# Patient Record
Sex: Female | Born: 1970
Health system: Southern US, Community
[De-identification: ages and names within clinical notes are randomized; demographics above are authoritative.]

## PROBLEM LIST (undated history)

## (undated) DIAGNOSIS — E7800 Pure hypercholesterolemia, unspecified: Secondary | ICD-10-CM

## (undated) DIAGNOSIS — J45909 Unspecified asthma, uncomplicated: Secondary | ICD-10-CM

## (undated) DIAGNOSIS — I10 Essential (primary) hypertension: Secondary | ICD-10-CM

## (undated) DIAGNOSIS — M1990 Unspecified osteoarthritis, unspecified site: Secondary | ICD-10-CM

## (undated) DIAGNOSIS — J3501 Chronic tonsillitis: Secondary | ICD-10-CM

## (undated) DIAGNOSIS — O24419 Gestational diabetes mellitus in pregnancy, unspecified control: Secondary | ICD-10-CM

## (undated) DIAGNOSIS — D649 Anemia, unspecified: Secondary | ICD-10-CM

## (undated) DIAGNOSIS — B1910 Unspecified viral hepatitis B without hepatic coma: Secondary | ICD-10-CM

## (undated) DIAGNOSIS — G43909 Migraine, unspecified, not intractable, without status migrainosus: Secondary | ICD-10-CM

## (undated) DIAGNOSIS — R51 Headache: Principal | ICD-10-CM

## (undated) HISTORY — PX: NO PAST SURGERIES: SHX2092

## (undated) HISTORY — PX: TONSILLECTOMY: SUR1361

## (undated) HISTORY — DX: Gestational diabetes mellitus in pregnancy, unspecified control: O24.419

---

## 2006-01-13 ENCOUNTER — Emergency Department (HOSPITAL_COMMUNITY): Admission: EM | Admit: 2006-01-13 | Discharge: 2006-01-13 | Payer: Self-pay | Admitting: Emergency Medicine

## 2008-06-22 ENCOUNTER — Inpatient Hospital Stay (HOSPITAL_COMMUNITY): Admission: AD | Admit: 2008-06-22 | Discharge: 2008-06-28 | Payer: Self-pay | Admitting: Obstetrics & Gynecology

## 2008-06-23 ENCOUNTER — Encounter: Payer: Self-pay | Admitting: Obstetrics

## 2009-01-19 ENCOUNTER — Emergency Department (HOSPITAL_COMMUNITY): Admission: EM | Admit: 2009-01-19 | Discharge: 2009-01-19 | Payer: Self-pay | Admitting: Emergency Medicine

## 2010-04-22 LAB — COMPREHENSIVE METABOLIC PANEL
ALT: 43 U/L — ABNORMAL HIGH (ref 0–35)
ALT: 51 U/L — ABNORMAL HIGH (ref 0–35)
AST: 32 U/L (ref 0–37)
Albumin: 2.1 g/dL — ABNORMAL LOW (ref 3.5–5.2)
Albumin: 2.4 g/dL — ABNORMAL LOW (ref 3.5–5.2)
Alkaline Phosphatase: 141 U/L — ABNORMAL HIGH (ref 39–117)
BUN: 3 mg/dL — ABNORMAL LOW (ref 6–23)
CO2: 23 mEq/L (ref 19–32)
CO2: 25 mEq/L (ref 19–32)
Calcium: 7.5 mg/dL — ABNORMAL LOW (ref 8.4–10.5)
Calcium: 8.7 mg/dL (ref 8.4–10.5)
Chloride: 109 mEq/L (ref 96–112)
Creatinine, Ser: 0.68 mg/dL (ref 0.4–1.2)
GFR calc non Af Amer: >60 mL/min (ref >60)
Glucose, Bld: 94 mg/dL (ref 70–99)
Sodium: 132 mEq/L — ABNORMAL LOW (ref 135–145)
Total Bilirubin: 0.7 mg/dL (ref 0.3–1.2)
Total Protein: 5.5 g/dL — ABNORMAL LOW (ref 6.0–8.3)

## 2010-04-22 LAB — CBC
HCT: 27.9 % — ABNORMAL LOW (ref 36.0–46.0)
HCT: 30.4 % — ABNORMAL LOW (ref 36.0–46.0)
Hemoglobin: 10.6 g/dL — ABNORMAL LOW (ref 12.0–15.0)
MCHC: 34.7 g/dL (ref 30.0–36.0)
MCHC: 34.9 g/dL (ref 30.0–36.0)
MCHC: 35 g/dL (ref 30.0–36.0)
MCV: 93.7 fL (ref 78.0–100.0)
MCV: 94.6 fL (ref 78.0–100.0)
Platelets: 130 10*3/uL — ABNORMAL LOW (ref 150–400)
RBC: 2.95 MIL/uL — ABNORMAL LOW (ref 3.87–5.11)
RBC: 3.24 MIL/uL — ABNORMAL LOW (ref 3.87–5.11)
RBC: 3.63 MIL/uL — ABNORMAL LOW (ref 3.87–5.11)
RDW: 13.9 % (ref 11.5–15.5)
RDW: 14.1 % (ref 11.5–15.5)

## 2010-04-22 LAB — URINALYSIS, ROUTINE W REFLEX MICROSCOPIC
Bilirubin Urine: NEGATIVE
Leukocytes, UA: NEGATIVE
Nitrite: NEGATIVE
Protein, ur: 100 mg/dL — AB
Specific Gravity, Urine: 1.02 (ref 1.005–1.030)
Urobilinogen, UA: 0.2 mg/dL (ref 0.0–1.0)

## 2010-04-22 LAB — URINE MICROSCOPIC-ADD ON

## 2010-04-22 LAB — BASIC METABOLIC PANEL
BUN: 5 mg/dL — ABNORMAL LOW (ref 6–23)
Calcium: 8.3 mg/dL — ABNORMAL LOW (ref 8.4–10.5)
Creatinine, Ser: 0.64 mg/dL (ref 0.4–1.2)
GFR calc Af Amer: >60 mL/min (ref >60)
GFR calc non Af Amer: >60 mL/min (ref >60)
Glucose, Bld: 136 mg/dL — ABNORMAL HIGH (ref 70–99)

## 2010-04-22 LAB — GLUCOSE, CAPILLARY: Glucose-Capillary: 72 mg/dL (ref 70–99)

## 2010-04-22 LAB — URIC ACID: Uric Acid, Serum: 6.6 mg/dL (ref 2.4–7.0)

## 2010-04-22 LAB — MAGNESIUM: Magnesium: 5.8 mg/dL — ABNORMAL HIGH (ref 1.5–2.5)

## 2010-05-28 NOTE — Discharge Summary (Signed)
Mary Richardson, Mary Richardson                  ACCOUNT NO.:  [id number]   MEDICAL RECORD NO.:  [id number]          PATIENT TYPE:  INP   LOCATION:  9141                          FACILITY:  [facility]   PHYSICIAN:  Charles A. Clearance Coots, M.D.DATE OF BIRTH:  [birth year]   DATE OF ADMISSION:  06/22/2008  DATE OF DISCHARGE:  06/28/2008                               DISCHARGE SUMMARY   ADMITTING DIAGNOSIS:  A 37 weeks' gestation, preeclampsia.   DISCHARGE DIAGNOSES:  A 37 weeks' gestation, preeclampsia, status post  normal spontaneous delivery, viable female infant on June 23, 2008 at  1813, Apgars of 8 at 1 minute and 4 at 5 minutes and 9 at 10 minutes.  Postpartum, preeclampsia resolving.  The patient and infant discharged  home in good condition.   REASON FOR ADMISSION:  A 40 year old G1, estimated date of confinement  July 14, 2008, presented to the office with complaint of tiredness and  swelling of her feet.  She denied headache or visual changes.  The  pregnancy had been complicated by gestational diabetes and this was well  controlled with diet.  Blood pressures had been increasing during the  latter week of prenatal care.   PAST MEDICAL HISTORY:   SURGERY:  None.   ILLNESSES:  Migraines.   MEDICATIONS:  Prenatal vitamins.   ALLERGIES:  No known drug allergies.   FAMILY HISTORY:  Remarkable for diabetes and hypertension.   SOCIAL HISTORY:  Negative for tobacco, alcohol, or recreational drug  use.   REVIEW OF SYSTEMS:  Per history of present illness, remarkable for  lethargy and swelling of feet in lower extremity.   PHYSICAL EXAMINATION:  GENERAL:  Well-nourished, well-developed female  in no acute distress.  VITAL SIGNS:  Temperature 98.8, pulse 72, respiratory rate 18, blood  pressure 160/98, and repeat blood pressure was 150/90.  LUNGS:  Clear to  auscultation bilaterally.  HEART:  Regular rate and rhythm.  HEENT:  Normal.  ABDOMEN:  Gravid, nontender.  Cervix 1 cm dilated, 50% effaced,  and  vertex at a -3 station.   The patient was sent for ultrasound and ultrasound measurements were  consistent with 34 weeks' gestation and amniotic fluid index was within  normal limits.  Placenta was posterior with no placenta previa and the  presentation was cephalic.  The findings were discussed with Maternal  Fetal Medicine here at [facility] along with her blood and urine.   LABORATORY WORK:  Remarkable for hemoglobin of 10, hematocrit 30, white  blood cell count 6000, platelets of 146,000, sodium 138, potassium 3.3.  AST was 32 and ALT was 43.  LDH was 141 and uric acid 5.8.  Urinalysis  revealed protein of 100 mg/dL.  After discussion of findings with  Maternal Fetal Medicine, the recommendation was to admit the patient and  induce labor for preeclampsia.   HOSPITAL COURSE:  The patient was admitted and two-stage induction of  labor was started.  The patient progressed quite well on Pitocin and had  an normal spontaneous vaginal delivery on June 23, 2008, without  complications.  Postpartum course was complicated  by increased blood  pressures.  The patient was placed on magnesium sulfate during labor and  continued on magnesium sulfate postpartum along with IV and p.o.  labetalol as needed.  Blood pressures slowly decreased and urine output  slowly increased until her urine output was brisk by postpartum day #3  and the magnesium sulfate was discontinued.  She continued to improve  following hospital stay and was discharged home on postpartum day #6  with reassuring blood pressures and brisk urine output.   DISCHARGE LABORATORY:  Hemoglobin 9.7, hematocrit 27, white blood cell  count 10,900, and platelets 130,000.  AST was 41 and ALT was 51.  Uric  acid was 6.6 and LDH was 239.   DISCHARGE DISPOSITION:  Medications, Coreg and Norvasc was prescribed  for blood pressure management.  Percocet and ibuprofen was prescribed  for pain.  Blood pressures will be followed at  home by the home nurse  that will check the patient's blood pressure.  She is to follow up in  the office in 2 weeks.      Charles A. Clearance Coots, M.D.  Electronically Signed     CAH/MEDQ  D:  06/28/2008  T:  06/28/2008  Job:  [id number]

## 2010-11-04 ENCOUNTER — Emergency Department (HOSPITAL_BASED_OUTPATIENT_CLINIC_OR_DEPARTMENT_OTHER)
Admission: EM | Admit: 2010-11-04 | Discharge: 2010-11-04 | Disposition: A | Discharge status: Home / Self Care | Payer: Self-pay | Attending: Emergency Medicine | Admitting: Emergency Medicine

## 2010-11-04 ENCOUNTER — Encounter: Payer: Self-pay | Admitting: Student

## 2010-11-04 DIAGNOSIS — N750 Cyst of Bartholin's gland: Secondary | ICD-10-CM | POA: Insufficient documentation

## 2010-11-04 LAB — URINALYSIS, ROUTINE W REFLEX MICROSCOPIC
Leukocytes, UA: NEGATIVE
Nitrite: NEGATIVE
Urobilinogen, UA: 0.2 mg/dL (ref 0.0–1.0)
pH: 5 (ref 5.0–8.0)

## 2010-11-04 LAB — PREGNANCY, URINE: Preg Test, Ur: NEGATIVE

## 2010-11-04 MED ORDER — LIDOCAINE HCL 2 % IJ SOLN
INTRAMUSCULAR | Status: AC
  Administered 2010-11-04: 11:00:00
  Filled 2010-11-04: qty 1

## 2010-11-04 MED ORDER — SULFAMETHOXAZOLE-TRIMETHOPRIM 800-160 MG PO TABS: 1.0000 | ORAL_TABLET | Freq: Two times a day (BID) | ORAL | Status: AC

## 2010-11-04 MED ORDER — OXYCODONE-ACETAMINOPHEN 5-325 MG PO TABS: 2.0000 | ORAL_TABLET | ORAL | Status: AC | PRN

## 2010-11-04 NOTE — ED Notes (Signed)
Pt reports abscess/boil to outer right labia x 3 days

## 2010-11-04 NOTE — ED Provider Notes (Signed)
History     CSN: [id number] Arrival date & time: 11/04/2010 10:26 AM   First MD Initiated Contact with Patient 11/04/10 1044      Chief Complaint  Patient presents with  . Abscess    (Consider location/radiation/quality/duration/timing/severity/associated sxs/prior treatment) HPI Comments: Abscess to right labia.  No alleviating factors.  Worse with touching, palpation.  Patient is a 40 y.o. female presenting with abscess. The history is provided by the patient.  Abscess  This is a new problem. The current episode started less than one week ago. The onset was gradual. The problem occurs continuously. The problem has been gradually worsening. Affected Location: right labia. The problem is moderate. The abscess is characterized by swelling.    History reviewed. No pertinent past medical history.  History reviewed. No pertinent past surgical history.  History reviewed. No pertinent family history.  History  Substance Use Topics  . Smoking status: Never Smoker   . Smokeless tobacco: Not on file  . Alcohol Use: No    OB History    Grav Para Term Preterm Abortions TAB SAB Ect Mult Living                  Review of Systems  Constitutional: Negative.  Negative for chills and fatigue.  Gastrointestinal: Negative.   Genitourinary: Positive for vaginal pain.  Skin:       Abscess as above.    Allergies  Review of patient's allergies indicates no known allergies.  Home Medications  No current outpatient prescriptions on file.  BP 127/90  Pulse 79  Temp(Src) 98.6 F (37 C) (Oral)  Resp 20  Wt 213 lb (96.616 kg)  SpO2 100%  LMP 10/23/2010  Physical Exam  Constitutional: She is oriented to person, place, and time. She appears well-developed and well-nourished. No distress.  HENT:  Head: Normocephalic and atraumatic.  Neck: Normal range of motion. Neck supple.  Genitourinary:       There is moderate-sized abscess located on the right labia.  It is exquisitely  tender to the touch.  Musculoskeletal: Normal range of motion.  Neurological: She is alert and oriented to person, place, and time.  Skin: Skin is warm and dry. She is not diaphoretic.    ED Course  INCISION AND DRAINAGE Date/Time: 11/04/2010 11:04 AM Performed by: Geoffery Lyons Authorized by: Geoffery Lyons Consent: Verbal consent obtained. Risks and benefits: risks, benefits and alternatives were discussed Consent given by: patient Patient understanding: patient states understanding of the procedure being performed Patient consent: the patient's understanding of the procedure matches consent given Procedure consent: procedure consent matches procedure scheduled Relevant documents: relevant documents present and verified Patient identity confirmed: verbally with patient Type: abscess Body area: anogenital Anesthesia: local infiltration Local anesthetic: lidocaine 2% without epinephrine Patient sedated: no Incision type: single straight Complexity: simple Drainage: purulent Drainage amount: moderate Wound treatment: wound left open Packing material: 1/4 in iodoform gauze Patient tolerance: Patient tolerated the procedure well with no immediate complications.   (including critical care time)   Labs Reviewed  PREGNANCY, URINE  URINALYSIS, ROUTINE W REFLEX MICROSCOPIC   No results found.   No diagnosis found.    MDM          Geoffery Lyons, MD 11/04/10 1106

## 2010-11-04 NOTE — ED Notes (Signed)
Gyn cart and i/d tray at bedside

## 2010-12-19 ENCOUNTER — Encounter (HOSPITAL_BASED_OUTPATIENT_CLINIC_OR_DEPARTMENT_OTHER): Payer: Self-pay | Admitting: *Deleted

## 2010-12-19 ENCOUNTER — Emergency Department (HOSPITAL_BASED_OUTPATIENT_CLINIC_OR_DEPARTMENT_OTHER)
Admission: EM | Admit: 2010-12-19 | Discharge: 2010-12-19 | Disposition: A | Discharge status: Home / Self Care | Payer: Self-pay | Attending: Emergency Medicine | Admitting: Emergency Medicine

## 2010-12-19 ENCOUNTER — Emergency Department (INDEPENDENT_AMBULATORY_CARE_PROVIDER_SITE_OTHER): Payer: Self-pay

## 2010-12-19 DIAGNOSIS — I10 Essential (primary) hypertension: Secondary | ICD-10-CM | POA: Insufficient documentation

## 2010-12-19 DIAGNOSIS — R05 Cough: Secondary | ICD-10-CM

## 2010-12-19 DIAGNOSIS — J45909 Unspecified asthma, uncomplicated: Secondary | ICD-10-CM

## 2010-12-19 DIAGNOSIS — R0989 Other specified symptoms and signs involving the circulatory and respiratory systems: Secondary | ICD-10-CM

## 2010-12-19 DIAGNOSIS — J069 Acute upper respiratory infection, unspecified: Secondary | ICD-10-CM | POA: Insufficient documentation

## 2010-12-19 DIAGNOSIS — R151 Fecal smearing: Secondary | ICD-10-CM | POA: Insufficient documentation

## 2010-12-19 DIAGNOSIS — R110 Nausea: Secondary | ICD-10-CM | POA: Insufficient documentation

## 2010-12-19 HISTORY — DX: Essential (primary) hypertension: I10

## 2010-12-19 MED ORDER — ALBUTEROL SULFATE HFA 108 (90 BASE) MCG/ACT IN AERS
2.0000 | INHALATION_SPRAY | RESPIRATORY_TRACT | Status: DC | PRN
  Administered 2010-12-19: 2 via RESPIRATORY_TRACT
  Filled 2010-12-19: qty 6.7

## 2010-12-19 MED ORDER — SODIUM CHLORIDE 0.9 % IV BOLUS (SEPSIS)
1000.0000 mL | Freq: Once | INTRAVENOUS | Status: AC
  Administered 2010-12-19: 1000 mL via INTRAVENOUS

## 2010-12-19 MED ORDER — ALBUTEROL SULFATE (5 MG/ML) 0.5% IN NEBU
2.5000 mg | INHALATION_SOLUTION | Freq: Once | RESPIRATORY_TRACT | Status: AC
  Administered 2010-12-19: 2.5 mg via RESPIRATORY_TRACT
  Filled 2010-12-19: qty 0.5

## 2010-12-19 MED ORDER — ONDANSETRON HCL 4 MG/2ML IJ SOLN
4.0000 mg | Freq: Once | INTRAMUSCULAR | Status: AC
  Administered 2010-12-19: 4 mg via INTRAVENOUS
  Filled 2010-12-19: qty 2

## 2010-12-19 MED ORDER — BENZONATATE 100 MG PO CAPS
200.0000 mg | ORAL_CAPSULE | Freq: Once | ORAL | Status: AC
  Administered 2010-12-19: 200 mg via ORAL
  Filled 2010-12-19: qty 2

## 2010-12-19 MED ORDER — BENZONATATE 200 MG PO CAPS: 200.0000 mg | ORAL_CAPSULE | Freq: Once | ORAL | Status: AC

## 2010-12-19 MED ORDER — BENZONATATE 100 MG PO CAPS: 100.0000 mg | ORAL_CAPSULE | Freq: Three times a day (TID) | ORAL | Status: AC | PRN

## 2010-12-19 NOTE — ED Notes (Signed)
Cough fever headache sore throat and aching all over x 2 days. Has been taking otc medication without relief.

## 2010-12-19 NOTE — ED Provider Notes (Signed)
History     CSN: [id number] Arrival date & time: 12/19/2010  2:01 PM   First MD Initiated Contact with Patient 12/19/10 1404      Chief Complaint  Patient presents with  . Cough    (Consider location/radiation/quality/duration/timing/severity/associated sxs/prior treatment) HPI Patient is a 40 yo F with a history of asthma who presents with 3 days of fevers,cough,myalgias, and sore throat.  Her son had this first and now she has it.  The patient did not get a flu shot this year.  She feels like she could use an albuterol treatment but she did not have any at home.  She has not been treated recently with steroids and she has never been hospitalized for her asthma.  There are no other associated or modifying factors. Past Medical History  Diagnosis Date  . Hypertension     History reviewed. No pertinent past surgical history.  History reviewed. No pertinent family history.  History  Substance Use Topics  . Smoking status: Never Smoker   . Smokeless tobacco: Not on file  . Alcohol Use: No    OB History    Grav Para Term Preterm Abortions TAB SAB Ect Mult Living                  Review of Systems  Constitutional: Positive for fever.  HENT: Positive for congestion.   Eyes: Negative.   Respiratory: Positive for cough.   Cardiovascular: Negative.   Gastrointestinal: Positive for nausea.  Genitourinary: Negative.   Musculoskeletal: Positive for myalgias.  Skin: Negative.   Neurological: Negative.   Hematological: Negative.   Psychiatric/Behavioral: Negative.   All other systems reviewed and are negative.    Allergies  Review of patient's allergies indicates no known allergies.  Home Medications  No current outpatient prescriptions on file.  BP 130/76  Pulse 79  Temp(Src) 99.2 F (37.3 C) (Oral)  Resp 22  SpO2 100%  Physical Exam  Nursing note and vitals reviewed. Constitutional: She is oriented to person, place, and time. She appears well-developed and  well-nourished. No distress.  HENT:  Head: Normocephalic and atraumatic.  Eyes: Conjunctivae and EOM are normal. Pupils are equal, round, and reactive to light.  Neck: Normal range of motion.  Cardiovascular: Normal rate, regular rhythm, normal heart sounds and intact distal pulses.  Exam reveals no gallop and no friction rub.   No murmur heard. Pulmonary/Chest: Effort normal. No respiratory distress. She has wheezes. She has no rales.  Abdominal: Soft. Bowel sounds are normal. She exhibits no distension. There is no tenderness. There is no rebound and no guarding.  Musculoskeletal: Normal range of motion.  Neurological: She is alert and oriented to person, place, and time. No cranial nerve deficit. She exhibits normal muscle tone. Coordination normal.  Skin: Skin is warm and dry. No rash noted.  Psychiatric: She has a normal mood and affect.    ED Course  Procedures (including critical care time)  Labs Reviewed - No data to display Dg Chest 2 View  12/19/2010  *RADIOLOGY REPORT*  Clinical Data: Cough with congestion.  History of asthma.  CHEST - 2 VIEW  Comparison: 01/19/2009  Findings: Normal heart size with no visible airspace disease.  No bony abnormality. No significant change from priors.  IMPRESSION: No active disease.  Original Report Authenticated By: [name], M.D.     No diagnosis found.    MDM  Patient was evaluated.  Based on her lung findings CXR was performed.  She was  out of the window for treatment with tamiflu.  The patient was treated with albuterol, tessalon perles, IVF, and zofran.  The patient had negative CXR and felt better.  She was given an albuterol inhaler and was discharged with tessalon perles in good condition.         [name], MD 12/19/10 2226

## 2012-01-14 NOTE — L&D Delivery Note (Signed)
Delivery Note At  a viable unspecified sex was delivered via  (Presentation: ;  ).  APGAR: , ; weight .   Placenta status: Intact, Manual removal.  Cord: 3 vessels with the following complications: .  Cord pH: not done  Anesthesia: Epidural  Episiotomy:  Lacerations:  Suture Repair: 2.0 vicryl Est. Blood Loss (mL):   Mom to postpartum.  Baby to Couplet care / Skin to Skin.  Jerick Khachatryan A 01/11/2013, 9:15 PM

## 2012-03-01 ENCOUNTER — Encounter (HOSPITAL_BASED_OUTPATIENT_CLINIC_OR_DEPARTMENT_OTHER): Payer: Self-pay | Admitting: *Deleted

## 2012-03-01 ENCOUNTER — Emergency Department (HOSPITAL_BASED_OUTPATIENT_CLINIC_OR_DEPARTMENT_OTHER): Payer: Self-pay

## 2012-03-01 ENCOUNTER — Emergency Department (HOSPITAL_BASED_OUTPATIENT_CLINIC_OR_DEPARTMENT_OTHER)
Admission: EM | Admit: 2012-03-01 | Discharge: 2012-03-01 | Disposition: A | Discharge status: Home / Self Care | Payer: Self-pay | Attending: Emergency Medicine | Admitting: Emergency Medicine

## 2012-03-01 DIAGNOSIS — J40 Bronchitis, not specified as acute or chronic: Secondary | ICD-10-CM | POA: Insufficient documentation

## 2012-03-01 DIAGNOSIS — R509 Fever, unspecified: Secondary | ICD-10-CM | POA: Insufficient documentation

## 2012-03-01 DIAGNOSIS — R0602 Shortness of breath: Secondary | ICD-10-CM | POA: Insufficient documentation

## 2012-03-01 DIAGNOSIS — I10 Essential (primary) hypertension: Secondary | ICD-10-CM | POA: Insufficient documentation

## 2012-03-01 DIAGNOSIS — R1110 Vomiting, unspecified: Secondary | ICD-10-CM | POA: Insufficient documentation

## 2012-03-01 DIAGNOSIS — J3489 Other specified disorders of nose and nasal sinuses: Secondary | ICD-10-CM | POA: Insufficient documentation

## 2012-03-01 DIAGNOSIS — R52 Pain, unspecified: Secondary | ICD-10-CM | POA: Insufficient documentation

## 2012-03-01 DIAGNOSIS — R51 Headache: Secondary | ICD-10-CM | POA: Insufficient documentation

## 2012-03-01 MED ORDER — ALBUTEROL SULFATE HFA 108 (90 BASE) MCG/ACT IN AERS
2.0000 | INHALATION_SPRAY | RESPIRATORY_TRACT | Status: DC | PRN
  Administered 2012-03-01: 2 via RESPIRATORY_TRACT
  Filled 2012-03-01: qty 6.7

## 2012-03-01 MED ORDER — OXYMETAZOLINE HCL 0.05 % NA SOLN
2.0000 | Freq: Two times a day (BID) | NASAL | Status: DC | PRN
  Administered 2012-03-01: 2 via NASAL
  Filled 2012-03-01: qty 15

## 2012-03-01 MED ORDER — HYDROCOD POLST-CHLORPHEN POLST 10-8 MG/5ML PO LQCR: 5.0000 mL | Freq: Two times a day (BID) | ORAL | Status: DC | PRN

## 2012-03-01 NOTE — ED Notes (Signed)
Cough, fever, aching all over, and headache x 1 week.

## 2012-03-01 NOTE — ED Provider Notes (Signed)
History  This chart was scribed for Hanley Seamen, MD by Bennett Scrape, ED Scribe. This patient was seen in room MH06/MH06 and the patient's care was started at 10:58 PM.  CSN: [id number]  Arrival date & time 03/01/12  2128   First MD Initiated Contact with Patient 03/01/12 2258      Chief Complaint  Patient presents with  . Cough    The history is provided by the patient. No language interpreter was used.    Mary Richardson is a 42 y.o. female who presents to the Emergency Department complaining of one week history of flulike symptoms, meaning fever, body aches, cough, nasal congestion shortness of breath. She is here with persistent SOB described as feeling like she has to work harder to intake a breath, with associated non-productive cough, subjective fever, myalgias, one episode of post tussive emesis and HA. The SOB is worse with laying down. She reports taking Nyquil and Dayquil with no imrpovement. She denies nausea, vomiting, diarrhea, abdominal pain, urinary symptoms and chills as associated symptoms. She reports that she used to have an inhaler "a long time ago" but does not have one currently. She is not clear as to why she was ever prescribed an inhaler. She has a h/o HTN and denies smoking and alcohol use.    Past Medical History  Diagnosis Date  . Hypertension     History reviewed. No pertinent past surgical history.  No family history on file.  History  Substance Use Topics  . Smoking status: Never Smoker   . Smokeless tobacco: Not on file  . Alcohol Use: No    No OB history provided.  Review of Systems  A complete 10 system review of systems was obtained and all systems are negative except as noted in the HPI and PMH.   Allergies  Review of patient's allergies indicates no known allergies.  Home Medications  No current outpatient prescriptions on file.  Triage Vitals: BP 128/81  Pulse 99  Temp(Src) 97.8 F (36.6 C) (Oral)  Resp 24  SpO2  100%  Physical Exam  Nursing note and vitals reviewed. Constitutional: She is oriented to person, place, and time. She appears well-developed and well-nourished. No distress.  HENT:  Head: Normocephalic and atraumatic.  Mouth/Throat: Oropharynx is clear and moist.  Nasal congestion  Eyes: EOM are normal. Pupils are equal, round, and reactive to light.  Neck: Neck supple. No tracheal deviation present.  Cardiovascular: Normal rate and regular rhythm.   Pulmonary/Chest: Effort normal and breath sounds normal. No respiratory distress. She has no wheezes.  Dry cough  Abdominal: Soft. She exhibits no distension.  Musculoskeletal: Normal range of motion. She exhibits no edema.  Lymphadenopathy:    She has no cervical adenopathy.  Neurological: She is alert and oriented to person, place, and time. No sensory deficit.  Skin: Skin is warm and dry.  Psychiatric: She has a normal mood and affect. Her behavior is normal.   Addendum: Nasal congestion  ED Course  Procedures (including critical care time)  DIAGNOSTIC STUDIES: Oxygen Saturation is 100% on room air, normal by my interpretation.    COORDINATION OF CARE: 11:01 PM- Advised pt of radiology results and discussed that she most likely has influenza. Discussed discharge plan which includes inhaler and decongestant with pt and pt agreed to plan. Also advised pt to follow up and pt agreed.  Labs Reviewed - No data to display Dg Chest 2 View  03/01/2012  *RADIOLOGY REPORT*  Clinical Data: Cough,  congestion  CHEST - 2 VIEW  Comparison: Prior chest x-ray 12/19/2010  Findings: The lungs are well-aerated and free from pulmonary edema, focal airspace consolidation or pulmonary nodule.  Cardiac and mediastinal contours are within normal limits.  No pneumothorax, or pleural effusion. No acute osseous findings.  IMPRESSION:  No acute cardiopulmonary disease.   Original Report Authenticated By: Malachy Moan, M.D.      1. Bronchitis        MDM  Suspect influenza with lingering symptoms.   I personally performed the services described in this documentation, which was scribed in my presence.  The recorded information has been reviewed and is accurate.       Hanley Seamen, MD 03/01/12 2320

## 2012-05-30 ENCOUNTER — Emergency Department (HOSPITAL_BASED_OUTPATIENT_CLINIC_OR_DEPARTMENT_OTHER): Payer: Self-pay

## 2012-05-30 ENCOUNTER — Emergency Department (HOSPITAL_BASED_OUTPATIENT_CLINIC_OR_DEPARTMENT_OTHER)
Admission: EM | Admit: 2012-05-30 | Discharge: 2012-05-30 | Disposition: A | Discharge status: Home / Self Care | Payer: Self-pay | Attending: Emergency Medicine | Admitting: Emergency Medicine

## 2012-05-30 ENCOUNTER — Encounter (HOSPITAL_BASED_OUTPATIENT_CLINIC_OR_DEPARTMENT_OTHER): Payer: Self-pay | Admitting: *Deleted

## 2012-05-30 DIAGNOSIS — N390 Urinary tract infection, site not specified: Secondary | ICD-10-CM | POA: Insufficient documentation

## 2012-05-30 DIAGNOSIS — O2390 Unspecified genitourinary tract infection in pregnancy, unspecified trimester: Secondary | ICD-10-CM | POA: Insufficient documentation

## 2012-05-30 DIAGNOSIS — O209 Hemorrhage in early pregnancy, unspecified: Secondary | ICD-10-CM | POA: Insufficient documentation

## 2012-05-30 DIAGNOSIS — O169 Unspecified maternal hypertension, unspecified trimester: Secondary | ICD-10-CM | POA: Insufficient documentation

## 2012-05-30 LAB — CBC
HCT: 34.4 % — ABNORMAL LOW (ref 36.0–46.0)
Hemoglobin: 11.4 g/dL — ABNORMAL LOW (ref 12.0–15.0)
MCH: 28.1 pg (ref 26.0–34.0)
MCHC: 33.1 g/dL (ref 30.0–36.0)
MCV: 84.7 fL (ref 78.0–100.0)
Platelets: 277 10*3/uL (ref 150–400)
RBC: 4.06 MIL/uL (ref 3.87–5.11)
RDW: 12.9 % (ref 11.5–15.5)
WBC: 7.8 10*3/uL (ref 4.0–10.5)

## 2012-05-30 LAB — BASIC METABOLIC PANEL
BUN: 12 mg/dL (ref 6–23)
CO2: 24 mEq/L (ref 19–32)
Calcium: 10.1 mg/dL (ref 8.4–10.5)
Chloride: 99 mEq/L (ref 96–112)
Creatinine, Ser: 0.6 mg/dL (ref 0.50–1.10)
GFR calc Af Amer: >90 mL/min (ref >90)
GFR calc non Af Amer: >90 mL/min (ref >90)
Glucose, Bld: 84 mg/dL (ref 70–99)
Potassium: 3.6 mEq/L (ref 3.5–5.1)
Sodium: 135 mEq/L (ref 135–145)

## 2012-05-30 LAB — URINALYSIS, ROUTINE W REFLEX MICROSCOPIC
Bilirubin Urine: NEGATIVE
Glucose, UA: NEGATIVE mg/dL
Ketones, ur: NEGATIVE mg/dL
Nitrite: NEGATIVE
Protein, ur: NEGATIVE mg/dL
Specific Gravity, Urine: 1.024 (ref 1.005–1.030)
Urobilinogen, UA: 0.2 mg/dL (ref 0.0–1.0)
pH: 5.5 (ref 5.0–8.0)

## 2012-05-30 LAB — URINE MICROSCOPIC-ADD ON

## 2012-05-30 LAB — HCG, QUANTITATIVE, PREGNANCY: hCG, Beta Chain, Quant, S: 35853 m[IU]/mL — ABNORMAL HIGH (ref <5)

## 2012-05-30 MED ORDER — CEPHALEXIN 500 MG PO CAPS: 500.0000 mg | ORAL_CAPSULE | Freq: Four times a day (QID) | ORAL | Status: DC

## 2012-05-30 NOTE — ED Notes (Signed)
Pt states she is [redacted] weeks pregnant and began bleeding "like a period" about 30 min ago. Denies cramping.

## 2012-05-30 NOTE — ED Notes (Signed)
Pelvic cart is at the bedside set up and ready for the doctor to use. 

## 2012-05-30 NOTE — ED Provider Notes (Signed)
Medical screening examination/treatment/procedure(s) were performed by non-physician practitioner and as supervising physician I was immediately available for consultation/collaboration.   Keoni Havey B. Bernette Mayers, MD 05/30/12 2041

## 2012-05-30 NOTE — ED Provider Notes (Signed)
History     CSN: [id number]  Arrival date & time 05/30/12  1552   First MD Initiated Contact with Patient 05/30/12 1618      Chief Complaint  Patient presents with  . Vaginal Bleeding    (Consider location/radiation/quality/duration/timing/severity/associated sxs/prior treatment) HPI Patient presents to the emergency department with vaginal bleeding, started this morning.  The patient, states, that she is about [redacted] weeks pregnant.  Patient denies any abdominal pain, or cramping.  Patient, states, that he did not seen any mucousy-type material mixed into the blood.  Patient denies nausea, vomiting, diarrhea, headache, blurred vision, chest pain, shortness of breath, back pain, fever, weakness, or syncope.  Patient, states she has an appointment with her OB/GYN on Tuesday Past Medical History  Diagnosis Date  . Hypertension     History reviewed. No pertinent past surgical history.  History reviewed. No pertinent family history.  History  Substance Use Topics  . Smoking status: Never Smoker   . Smokeless tobacco: Not on file  . Alcohol Use: No    OB History   Grav Para Term Preterm Abortions TAB SAB Ect Mult Living   2 1              Review of Systems All other systems negative except as documented in the HPI. All pertinent positives and negatives as reviewed in the HPI. Allergies  Review of patient's allergies indicates no known allergies.  Home Medications   Current Outpatient Rx  Name  Route  Sig  Dispense  Refill  . Prenatal Vit-Fe Fumarate-FA (PRENATAL MULTIVITAMIN) TABS   Oral   Take 1 tablet by mouth daily at 12 noon.         . chlorpheniramine-HYDROcodone (TUSSIONEX PENNKINETIC ER) 10-8 MG/5ML LQCR   Oral   Take 5 mLs by mouth every 12 (twelve) hours as needed (cough or body aches).   115 mL   0     BP 132/82  Pulse 90  Temp(Src) 98.7 F (37.1 C) (Oral)  Resp 20  Ht 5\' 7"  (1.702 m)  Wt 225 lb (102.059 kg)  BMI 35.23 kg/m2  SpO2 98%  LMP  04/12/2012  Physical Exam  Nursing note and vitals reviewed. Constitutional: She is oriented to person, place, and time. She appears well-developed and well-nourished. No distress.  HENT:  Head: Normocephalic and atraumatic.  Mouth/Throat: Oropharynx is clear and moist.  Eyes: Pupils are equal, round, and reactive to light.  Neck: Normal range of motion. Neck supple.  Cardiovascular: Normal rate, regular rhythm and normal heart sounds.  Exam reveals no gallop and no friction rub.   No murmur heard. Pulmonary/Chest: Effort normal and breath sounds normal. No respiratory distress.  Abdominal: Soft. Bowel sounds are normal. She exhibits no distension.  Genitourinary: No breast discharge. Cervix exhibits no motion tenderness, no discharge and no friability. Right adnexum displays no mass and no tenderness. Left adnexum displays no mass and no tenderness.  Patient has some mild blood noted in the vaginal vault.  There is no signs of products of conception noted in the cervical os and there is no tissue noted in the vaginal area.  Neurological: She is alert and oriented to person, place, and time. She exhibits normal muscle tone. Coordination normal.  Skin: Skin is warm and dry. No rash noted.    ED Course  Procedures (including critical care time)  Labs Reviewed  HCG, QUANTITATIVE, PREGNANCY - Abnormal; Notable for the following:    hCG, Beta Nyra Jabs, [place] [address] (*)  All other components within normal limits  CBC - Abnormal; Notable for the following:    Hemoglobin 11.4 (*)    HCT 34.4 (*)    All other components within normal limits  URINALYSIS, ROUTINE W REFLEX MICROSCOPIC - Abnormal; Notable for the following:    Color, Urine AMBER (*)    APPearance CLOUDY (*)    Hgb urine dipstick LARGE (*)    Leukocytes, UA SMALL (*)    All other components within normal limits  URINE MICROSCOPIC-ADD ON - Abnormal; Notable for the following:    Bacteria, UA MANY (*)    All other components  within normal limits  BASIC METABOLIC PANEL  ABO/RH   US Ob Comp Less 14 Wks  05/30/2012   *RADIOLOGY REPORT*  Clinical Data: Vaginal bleeding.  Pelvic pain.  6-week-6-day gestational age by LMP.  OBSTETRIC <14 WK [place] AND TRANSVAGINAL OB US  Technique:  Both transabdominal and transvaginal ultrasound examinations were performed for complete evaluation of the gestation as well as the maternal uterus, adnexal regions, and pelvic cul-de-sac.  Transvaginal technique was performed to assess early pregnancy.  Comparison:  None.  Intrauterine gestational sac:  Visualized/normal in shape. Yolk sac: Visualized Embryo: Not well visualized enough to obtain accurate CRL measurement Cardiac Activity: Visualized Heart Rate: 144 bpm  MSD: 24 mm  7 w 3 d CRL:  Could not be accurately measured  Maternal uterus/adnexae: Right ovary not visualized.  Left ovary is normal appearance.  No adnexal mass or free fluid identified.  IMPRESSION:  1. Technically difficult exam due to patient habitus and uterine lie.  Single living IUP which is concordant with LMP. 2.  No adnexal mass or free fluid identified.   Original Report Authenticated By: Myles Rosenthal, M.D.   US Ob Transvaginal  05/30/2012   *RADIOLOGY REPORT*  Clinical Data: Vaginal bleeding.  Pelvic pain.  6-week-6-day gestational age by LMP.  OBSTETRIC <14 WK [place] AND TRANSVAGINAL OB US  Technique:  Both transabdominal and transvaginal ultrasound examinations were performed for complete evaluation of the gestation as well as the maternal uterus, adnexal regions, and pelvic cul-de-sac.  Transvaginal technique was performed to assess early pregnancy.  Comparison:  None.  Intrauterine gestational sac:  Visualized/normal in shape. Yolk sac: Visualized Embryo: Not well visualized enough to obtain accurate CRL measurement Cardiac Activity: Visualized Heart Rate: 144 bpm  MSD: 24 mm  7 w 3 d CRL:  Could not be accurately measured  Maternal uterus/adnexae: Right ovary not visualized.  Left  ovary is normal appearance.  No adnexal mass or free fluid identified.  IMPRESSION:  1. Technically difficult exam due to patient habitus and uterine lie.  Single living IUP which is concordant with LMP. 2.  No adnexal mass or free fluid identified.   Original Report Authenticated By: Myles Rosenthal, M.D.     Patient be referred to GYN for her visit on Tuesday patient is advised return here as needed advised the patient that she still could be having an inevitable miscarriage.  Patient will be treated for UTI   MDM  MDM Reviewed: vitals and nursing note Interpretation: labs and ultrasound            Carlyle Dolly, PA-C 05/30/12 2037

## 2012-05-31 ENCOUNTER — Encounter (HOSPITAL_COMMUNITY): Payer: Self-pay | Admitting: Emergency Medicine

## 2012-05-31 ENCOUNTER — Emergency Department (HOSPITAL_COMMUNITY)
Admission: EM | Admit: 2012-05-31 | Discharge: 2012-05-31 | Disposition: A | Discharge status: Home / Self Care | Payer: Self-pay | Attending: Emergency Medicine | Admitting: Emergency Medicine

## 2012-05-31 DIAGNOSIS — E669 Obesity, unspecified: Secondary | ICD-10-CM | POA: Insufficient documentation

## 2012-05-31 DIAGNOSIS — O169 Unspecified maternal hypertension, unspecified trimester: Secondary | ICD-10-CM | POA: Insufficient documentation

## 2012-05-31 DIAGNOSIS — J45909 Unspecified asthma, uncomplicated: Secondary | ICD-10-CM | POA: Insufficient documentation

## 2012-05-31 DIAGNOSIS — R109 Unspecified abdominal pain: Secondary | ICD-10-CM | POA: Insufficient documentation

## 2012-05-31 DIAGNOSIS — Z79899 Other long term (current) drug therapy: Secondary | ICD-10-CM | POA: Insufficient documentation

## 2012-05-31 DIAGNOSIS — O9989 Other specified diseases and conditions complicating pregnancy, childbirth and the puerperium: Secondary | ICD-10-CM | POA: Insufficient documentation

## 2012-05-31 DIAGNOSIS — O200 Threatened abortion: Secondary | ICD-10-CM | POA: Insufficient documentation

## 2012-05-31 HISTORY — DX: Unspecified asthma, uncomplicated: J45.909

## 2012-05-31 LAB — HCG, QUANTITATIVE, PREGNANCY: hCG, Beta Chain, Quant, S: 33956 m[IU]/mL — ABNORMAL HIGH (ref <5)

## 2012-05-31 NOTE — ED Provider Notes (Signed)
History    41yF with vaginal bleeding. Pregnant. 7 weeks. Seen in ED yesterday for same. IUP with FHT in 140s. Coming back in today because of concern for continues bleeding. Not really changed. Went through 2 pads yesterday and 1 thus far today. No tissue noted. Mild crampy abdominal pain. No urinary complaints. No fever or chills. Has ob appointment tomorrow.   CSN: [id number]  Arrival date & time 05/31/12  1011   First MD Initiated Contact with Patient 05/31/12 1021      Chief Complaint  Patient presents with  . Vaginal Bleeding    patient is pregnant    (Consider location/radiation/quality/duration/timing/severity/associated sxs/prior treatment) HPI  Past Medical History  Diagnosis Date  . Hypertension   . Asthma     History reviewed. No pertinent past surgical history.  No family history on file.  History  Substance Use Topics  . Smoking status: Never Smoker   . Smokeless tobacco: Not on file  . Alcohol Use: No    OB History   Grav Para Term Preterm Abortions TAB SAB Ect Mult Living   2 1              Review of Systems  All systems reviewed and negative, other than as noted in HPI.   Allergies  Review of patient's allergies indicates no known allergies.  Home Medications   Current Outpatient Rx  Name  Route  Sig  Dispense  Refill  . cephALEXin (KEFLEX) 500 MG capsule   Oral   Take 1 capsule (500 mg total) by mouth 4 (four) times daily.   28 capsule   0   . chlorpheniramine-HYDROcodone (TUSSIONEX PENNKINETIC ER) 10-8 MG/5ML LQCR   Oral   Take 5 mLs by mouth every 12 (twelve) hours as needed (cough or body aches).   115 mL   0   . Prenatal Vit-Fe Fumarate-FA (PRENATAL MULTIVITAMIN) TABS   Oral   Take 1 tablet by mouth daily at 12 noon.           BP 128/79  Pulse 81  Temp(Src) 98.3 F (36.8 C) (Oral)  Resp 18  Ht 5\' 7"  (1.702 m)  Wt 225 lb (102.059 kg)  BMI 35.23 kg/m2  SpO2 100%  LMP 04/12/2012  Physical Exam  Nursing note and  vitals reviewed. Constitutional: She appears well-developed and well-nourished. No distress.  Laying in bed. NAD. Obese.   HENT:  Head: Normocephalic and atraumatic.  Eyes: Conjunctivae are normal. Right eye exhibits no discharge. Left eye exhibits no discharge.  Neck: Neck supple.  Cardiovascular: Normal rate, regular rhythm and normal heart sounds.  Exam reveals no gallop and no friction rub.   No murmur heard. Pulmonary/Chest: Effort normal and breath sounds normal. No respiratory distress.  Abdominal: Soft. She exhibits no distension and no mass. There is no tenderness.  Genitourinary:  No cva tenderness  Musculoskeletal: She exhibits no edema and no tenderness.  Neurological: She is alert.  Skin: Skin is warm and dry.  Psychiatric: She has a normal mood and affect. Her behavior is normal. Thought content normal.    ED Course  Procedures (including critical care time)  Labs Reviewed  HCG, QUANTITATIVE, PREGNANCY   US Ob Comp Less 14 Wks  05/30/2012   *RADIOLOGY REPORT*  Clinical Data: Vaginal bleeding.  Pelvic pain.  6-week-6-day gestational age by LMP.  OBSTETRIC <14 WK [place] AND TRANSVAGINAL OB US  Technique:  Both transabdominal and transvaginal ultrasound examinations were performed for complete evaluation of the  gestation as well as the maternal uterus, adnexal regions, and pelvic cul-de-sac.  Transvaginal technique was performed to assess early pregnancy.  Comparison:  None.  Intrauterine gestational sac:  Visualized/normal in shape. Yolk sac: Visualized Embryo: Not well visualized enough to obtain accurate CRL measurement Cardiac Activity: Visualized Heart Rate: 144 bpm  MSD: 24 mm  7 w 3 d CRL:  Could not be accurately measured  Maternal uterus/adnexae: Right ovary not visualized.  Left ovary is normal appearance.  No adnexal mass or free fluid identified.  IMPRESSION:  1. Technically difficult exam due to patient habitus and uterine lie.  Single living IUP which is concordant with  LMP. 2.  No adnexal mass or free fluid identified.   Original Report Authenticated By: Myles Rosenthal, M.D.   US Ob Transvaginal  05/30/2012   *RADIOLOGY REPORT*  Clinical Data: Vaginal bleeding.  Pelvic pain.  6-week-6-day gestational age by LMP.  OBSTETRIC <14 WK [place] AND TRANSVAGINAL OB US  Technique:  Both transabdominal and transvaginal ultrasound examinations were performed for complete evaluation of the gestation as well as the maternal uterus, adnexal regions, and pelvic cul-de-sac.  Transvaginal technique was performed to assess early pregnancy.  Comparison:  None.  Intrauterine gestational sac:  Visualized/normal in shape. Yolk sac: Visualized Embryo: Not well visualized enough to obtain accurate CRL measurement Cardiac Activity: Visualized Heart Rate: 144 bpm  MSD: 24 mm  7 w 3 d CRL:  Could not be accurately measured  Maternal uterus/adnexae: Right ovary not visualized.  Left ovary is normal appearance.  No adnexal mass or free fluid identified.  IMPRESSION:  1. Technically difficult exam due to patient habitus and uterine lie.  Single living IUP which is concordant with LMP. 2.  No adnexal mass or free fluid identified.   Original Report Authenticated By: Myles Rosenthal, M.D.     1. Threatened miscarriage       MDM  [phone] with vaginal bleeding. IUP with FHT in 140s less than 24 hours ago. Symptoms not significantly changed at this time. I do not feel that repeat imaging of much utility. Will repeat quant. Pt has OB follow-up tomorrow.         Raeford Razor, MD 06/01/12 1344

## 2012-05-31 NOTE — ED Notes (Signed)
Patient states she is [redacted] weeks pregnant.   Patient states she started bleeding yesterday around 2pm.  Patient went to another ED around 2 pm.  They advised patient that everything looked good.   They advised that she had UTI and gave her antibiotics to take.   Patient states that this morning at 8am, she started bledding heavier and caused concern.   Patient wants to ensure pregnancy is okay.

## 2012-05-31 NOTE — ED Notes (Signed)
Kohut, MD at bedside. 

## 2012-06-01 LAB — OB RESULTS CONSOLE GC/CHLAMYDIA
Chlamydia: NEGATIVE
Gonorrhea: NEGATIVE

## 2012-06-01 LAB — OB RESULTS CONSOLE HEPATITIS B SURFACE ANTIGEN: Hepatitis B Surface Ag: POSITIVE

## 2012-06-01 LAB — OB RESULTS CONSOLE RUBELLA ANTIBODY, IGM: Rubella: IMMUNE

## 2012-06-01 LAB — OB RESULTS CONSOLE RPR: RPR: NONREACTIVE

## 2012-06-01 LAB — OB RESULTS CONSOLE HIV ANTIBODY (ROUTINE TESTING): HIV: NONREACTIVE

## 2012-06-11 ENCOUNTER — Inpatient Hospital Stay (HOSPITAL_COMMUNITY): Admission: AD | Admit: 2012-06-11 | Payer: Self-pay | Source: Ambulatory Visit | Admitting: Obstetrics

## 2012-10-22 ENCOUNTER — Encounter (HOSPITAL_COMMUNITY): Payer: Self-pay | Admitting: Obstetrics

## 2012-10-28 ENCOUNTER — Encounter (HOSPITAL_COMMUNITY): Payer: Self-pay | Admitting: Obstetrics

## 2012-10-28 ENCOUNTER — Ambulatory Visit (HOSPITAL_COMMUNITY)
Admission: RE | Admit: 2012-10-28 | Discharge: 2012-10-28 | Disposition: A | Discharge status: Home / Self Care | Payer: Self-pay | Source: Ambulatory Visit | Attending: Obstetrics | Admitting: Obstetrics

## 2012-10-28 ENCOUNTER — Encounter: Payer: Self-pay | Attending: Obstetrics | Admitting: *Deleted

## 2012-10-28 DIAGNOSIS — O99810 Abnormal glucose complicating pregnancy: Secondary | ICD-10-CM | POA: Insufficient documentation

## 2012-10-28 DIAGNOSIS — Z713 Dietary counseling and surveillance: Secondary | ICD-10-CM | POA: Insufficient documentation

## 2012-10-28 NOTE — Consult Note (Signed)
DIABETES:  Patient was seen on 10/27/12 for Gestational Diabetes self-management  The following learning objectives were met by the patient during this course:   States the definition of Gestational Diabetes  States why dietary management is important in controlling blood glucose  Describes the effects of carbohydrates on blood glucose levels  Demonstrates ability to create a balanced meal plan  Demonstrates carbohydrate counting   States when to check blood glucose levels  Demonstrates proper blood glucose monitoring techniques  States the effect of stress and exercise on blood glucose levels  Plan:  Aim for 2 Carb Choices per meal (30 grams) +/- 1 either way for breakfast Aim for 3 Carb Choices per meal (45 grams) +/- 1 either way from lunch and dinner Aim for 1-2 Carbs per snack Begin reading food labels for Total Carbohydrate and sugar grams of foods Consider  increasing your activity level by walking daily as tolerated Begin checking BG before breakfast and 2 hours after first bit of breakfast, lunch and dinner as directed by MD   Blood glucose monitor given: Patient is self pay: Recommended purchase of Reli[employer] Glucometer, strips and lancets Blood glucose reading: 68 Patient had GDM with last pregnancy. She is aware of how to test properly. Verbalized understanding of process and frequency of testing.  Patient instructed to monitor glucose levels: FBS: 60 - <90 2 hour: <120  Patient received the following handouts:  Nutrition Diabetes and Pregnancy  Carbohydrate Counting List  Meal Planning worksheet  Patient will be seen for follow-up as needed.

## 2012-11-02 ENCOUNTER — Telehealth (HOSPITAL_COMMUNITY): Payer: Self-pay | Admitting: *Deleted

## 2012-11-04 ENCOUNTER — Ambulatory Visit (HOSPITAL_COMMUNITY)
Admission: RE | Admit: 2012-11-04 | Discharge: 2012-11-04 | Disposition: A | Discharge status: Home / Self Care | Payer: Self-pay | Source: Ambulatory Visit | Attending: Obstetrics | Admitting: Obstetrics

## 2012-11-04 NOTE — Consult Note (Signed)
DIABETES: GDM f/u .Patient started on Glyburide 5mg  AM 2.5mg  PM on 11/02/12.  Premedication FBS 94-106, 2hpp 115-240. Post medication FBS  <90, 2hpp only one reading >120. Patient has been keeping readings on piece of paper. I provided her with a formal log for recording. Target ranges were provided. Patient advised to monitor readings and if numbers go outside of target ranges to return to clinic. She has an appointment with Dr. Gaynell Face in 1 1/2 weeks. Patient verbalized understanding.

## 2012-11-18 ENCOUNTER — Other Ambulatory Visit: Payer: Self-pay

## 2012-11-18 ENCOUNTER — Encounter: Payer: Self-pay | Attending: Obstetrics | Admitting: *Deleted

## 2012-12-06 ENCOUNTER — Other Ambulatory Visit (HOSPITAL_COMMUNITY): Payer: Self-pay | Admitting: Obstetrics

## 2012-12-06 DIAGNOSIS — O09529 Supervision of elderly multigravida, unspecified trimester: Secondary | ICD-10-CM

## 2012-12-07 ENCOUNTER — Other Ambulatory Visit (HOSPITAL_COMMUNITY): Payer: Self-pay | Admitting: Obstetrics

## 2012-12-07 ENCOUNTER — Encounter (HOSPITAL_COMMUNITY): Payer: Self-pay

## 2012-12-07 ENCOUNTER — Ambulatory Visit (HOSPITAL_COMMUNITY)
Admission: RE | Admit: 2012-12-07 | Discharge: 2012-12-07 | Disposition: A | Discharge status: Home / Self Care | Payer: Self-pay | Source: Ambulatory Visit | Attending: Obstetrics | Admitting: Obstetrics

## 2012-12-07 DIAGNOSIS — O09529 Supervision of elderly multigravida, unspecified trimester: Secondary | ICD-10-CM

## 2012-12-07 DIAGNOSIS — Z363 Encounter for antenatal screening for malformations: Secondary | ICD-10-CM | POA: Insufficient documentation

## 2012-12-07 DIAGNOSIS — O358XX0 Maternal care for other (suspected) fetal abnormality and damage, not applicable or unspecified: Secondary | ICD-10-CM | POA: Insufficient documentation

## 2012-12-07 DIAGNOSIS — O99810 Abnormal glucose complicating pregnancy: Secondary | ICD-10-CM | POA: Insufficient documentation

## 2012-12-07 DIAGNOSIS — O09299 Supervision of pregnancy with other poor reproductive or obstetric history, unspecified trimester: Secondary | ICD-10-CM | POA: Insufficient documentation

## 2012-12-07 DIAGNOSIS — Z1389 Encounter for screening for other disorder: Secondary | ICD-10-CM | POA: Insufficient documentation

## 2012-12-14 LAB — OB RESULTS CONSOLE GBS: GBS: NEGATIVE

## 2012-12-14 LAB — OB RESULTS CONSOLE GC/CHLAMYDIA
Chlamydia: NEGATIVE
Gonorrhea: NEGATIVE

## 2012-12-15 ENCOUNTER — Other Ambulatory Visit (HOSPITAL_COMMUNITY): Payer: Self-pay | Admitting: Obstetrics

## 2012-12-15 DIAGNOSIS — O09529 Supervision of elderly multigravida, unspecified trimester: Secondary | ICD-10-CM

## 2012-12-16 ENCOUNTER — Ambulatory Visit (HOSPITAL_COMMUNITY)
Admission: RE | Admit: 2012-12-16 | Discharge: 2012-12-16 | Disposition: A | Discharge status: Home / Self Care | Payer: Self-pay | Source: Ambulatory Visit | Attending: Obstetrics | Admitting: Obstetrics

## 2012-12-16 VITALS — BP 119/70 | HR 86 | Wt 231.0 lb

## 2012-12-16 DIAGNOSIS — O99810 Abnormal glucose complicating pregnancy: Secondary | ICD-10-CM | POA: Insufficient documentation

## 2012-12-16 DIAGNOSIS — O09529 Supervision of elderly multigravida, unspecified trimester: Secondary | ICD-10-CM | POA: Insufficient documentation

## 2012-12-16 DIAGNOSIS — O09299 Supervision of pregnancy with other poor reproductive or obstetric history, unspecified trimester: Secondary | ICD-10-CM | POA: Insufficient documentation

## 2012-12-16 NOTE — Progress Notes (Signed)
Mary Richardson was seen for ultrasound appointment today.  Please see [facility] report for details.

## 2012-12-20 ENCOUNTER — Encounter (HOSPITAL_COMMUNITY): Payer: Self-pay

## 2012-12-20 ENCOUNTER — Ambulatory Visit (HOSPITAL_COMMUNITY)
Admission: RE | Admit: 2012-12-20 | Discharge: 2012-12-20 | Disposition: A | Discharge status: Home / Self Care | Payer: Self-pay | Source: Ambulatory Visit | Attending: Obstetrics | Admitting: Obstetrics

## 2012-12-20 DIAGNOSIS — O09299 Supervision of pregnancy with other poor reproductive or obstetric history, unspecified trimester: Secondary | ICD-10-CM | POA: Insufficient documentation

## 2012-12-20 DIAGNOSIS — O99810 Abnormal glucose complicating pregnancy: Secondary | ICD-10-CM | POA: Insufficient documentation

## 2012-12-20 DIAGNOSIS — O09529 Supervision of elderly multigravida, unspecified trimester: Secondary | ICD-10-CM | POA: Insufficient documentation

## 2012-12-23 ENCOUNTER — Ambulatory Visit (HOSPITAL_COMMUNITY)
Admission: RE | Admit: 2012-12-23 | Discharge: 2012-12-23 | Disposition: A | Discharge status: Home / Self Care | Payer: Self-pay | Source: Ambulatory Visit | Attending: Obstetrics | Admitting: Obstetrics

## 2012-12-23 ENCOUNTER — Ambulatory Visit (HOSPITAL_COMMUNITY): Payer: Self-pay

## 2012-12-23 ENCOUNTER — Other Ambulatory Visit (HOSPITAL_COMMUNITY): Payer: Self-pay | Admitting: Maternal and Fetal Medicine

## 2012-12-23 DIAGNOSIS — O09529 Supervision of elderly multigravida, unspecified trimester: Secondary | ICD-10-CM

## 2012-12-23 DIAGNOSIS — O09299 Supervision of pregnancy with other poor reproductive or obstetric history, unspecified trimester: Secondary | ICD-10-CM | POA: Insufficient documentation

## 2012-12-23 DIAGNOSIS — O99810 Abnormal glucose complicating pregnancy: Secondary | ICD-10-CM | POA: Insufficient documentation

## 2012-12-27 ENCOUNTER — Ambulatory Visit (HOSPITAL_COMMUNITY)
Admission: RE | Admit: 2012-12-27 | Discharge: 2012-12-27 | Disposition: A | Discharge status: Home / Self Care | Payer: Medicaid Other | Source: Ambulatory Visit | Attending: Obstetrics | Admitting: Obstetrics

## 2012-12-27 DIAGNOSIS — O99810 Abnormal glucose complicating pregnancy: Secondary | ICD-10-CM | POA: Insufficient documentation

## 2012-12-27 DIAGNOSIS — O09299 Supervision of pregnancy with other poor reproductive or obstetric history, unspecified trimester: Secondary | ICD-10-CM | POA: Insufficient documentation

## 2012-12-27 DIAGNOSIS — O09529 Supervision of elderly multigravida, unspecified trimester: Secondary | ICD-10-CM | POA: Insufficient documentation

## 2012-12-30 ENCOUNTER — Ambulatory Visit (HOSPITAL_COMMUNITY)
Admission: RE | Admit: 2012-12-30 | Discharge: 2012-12-30 | Disposition: A | Discharge status: Home / Self Care | Payer: Medicaid Other | Source: Ambulatory Visit | Attending: Obstetrics | Admitting: Obstetrics

## 2012-12-30 ENCOUNTER — Ambulatory Visit (HOSPITAL_COMMUNITY): Payer: Self-pay

## 2012-12-30 ENCOUNTER — Ambulatory Visit (HOSPITAL_COMMUNITY): Payer: Medicaid Other

## 2012-12-30 DIAGNOSIS — O99810 Abnormal glucose complicating pregnancy: Secondary | ICD-10-CM | POA: Insufficient documentation

## 2012-12-30 DIAGNOSIS — O09299 Supervision of pregnancy with other poor reproductive or obstetric history, unspecified trimester: Secondary | ICD-10-CM | POA: Insufficient documentation

## 2012-12-30 DIAGNOSIS — O09529 Supervision of elderly multigravida, unspecified trimester: Secondary | ICD-10-CM | POA: Insufficient documentation

## 2013-01-03 ENCOUNTER — Encounter (HOSPITAL_COMMUNITY): Payer: Self-pay | Admitting: *Deleted

## 2013-01-03 ENCOUNTER — Telehealth (HOSPITAL_COMMUNITY): Payer: Self-pay | Admitting: *Deleted

## 2013-01-03 NOTE — Telephone Encounter (Signed)
Preadmission screen  

## 2013-01-04 ENCOUNTER — Other Ambulatory Visit (HOSPITAL_COMMUNITY): Payer: Self-pay | Admitting: Obstetrics

## 2013-01-04 DIAGNOSIS — O09529 Supervision of elderly multigravida, unspecified trimester: Secondary | ICD-10-CM

## 2013-01-05 ENCOUNTER — Ambulatory Visit (HOSPITAL_COMMUNITY): Payer: Medicaid Other

## 2013-01-05 ENCOUNTER — Ambulatory Visit (HOSPITAL_COMMUNITY)
Admission: RE | Admit: 2013-01-05 | Discharge: 2013-01-05 | Disposition: A | Discharge status: Home / Self Care | Payer: Medicaid Other | Source: Ambulatory Visit | Attending: Obstetrics | Admitting: Obstetrics

## 2013-01-05 DIAGNOSIS — O99810 Abnormal glucose complicating pregnancy: Secondary | ICD-10-CM | POA: Insufficient documentation

## 2013-01-05 DIAGNOSIS — O09299 Supervision of pregnancy with other poor reproductive or obstetric history, unspecified trimester: Secondary | ICD-10-CM | POA: Insufficient documentation

## 2013-01-05 DIAGNOSIS — O09529 Supervision of elderly multigravida, unspecified trimester: Secondary | ICD-10-CM | POA: Insufficient documentation

## 2013-01-11 ENCOUNTER — Encounter (HOSPITAL_COMMUNITY): Payer: Self-pay

## 2013-01-11 ENCOUNTER — Inpatient Hospital Stay (HOSPITAL_COMMUNITY): Payer: Medicaid Other | Admitting: Anesthesiology

## 2013-01-11 ENCOUNTER — Encounter (HOSPITAL_COMMUNITY): Payer: Medicaid Other | Admitting: Anesthesiology

## 2013-01-11 ENCOUNTER — Inpatient Hospital Stay (HOSPITAL_COMMUNITY)
Admission: RE | Admit: 2013-01-11 | Discharge: 2013-01-11 | Disposition: A | Discharge status: Home / Self Care | Payer: MEDICAID | Source: Ambulatory Visit | Attending: Obstetrics | Admitting: Obstetrics

## 2013-01-11 ENCOUNTER — Inpatient Hospital Stay (HOSPITAL_COMMUNITY)
Admission: AD | Admit: 2013-01-11 | Discharge: 2013-01-13 | DRG: 774 | Disposition: A | Discharge status: Home / Self Care | Payer: Medicaid Other | Source: Ambulatory Visit | Attending: Obstetrics | Admitting: Obstetrics

## 2013-01-11 DIAGNOSIS — O24429 Gestational diabetes mellitus in childbirth, unspecified control: Secondary | ICD-10-CM | POA: Diagnosis present

## 2013-01-11 DIAGNOSIS — B1910 Unspecified viral hepatitis B without hepatic coma: Secondary | ICD-10-CM | POA: Diagnosis present

## 2013-01-11 DIAGNOSIS — O99814 Abnormal glucose complicating childbirth: Principal | ICD-10-CM | POA: Diagnosis present

## 2013-01-11 DIAGNOSIS — O26619 Liver and biliary tract disorders in pregnancy, unspecified trimester: Secondary | ICD-10-CM | POA: Diagnosis present

## 2013-01-11 DIAGNOSIS — O09529 Supervision of elderly multigravida, unspecified trimester: Secondary | ICD-10-CM | POA: Diagnosis present

## 2013-01-11 HISTORY — DX: Unspecified viral hepatitis B without hepatic coma: B19.10

## 2013-01-11 LAB — TYPE AND SCREEN: Antibody Screen: NEGATIVE

## 2013-01-11 LAB — GLUCOSE, CAPILLARY
Glucose-Capillary: 75 mg/dL (ref 70–99)
Glucose-Capillary: 67 mg/dL — ABNORMAL LOW (ref 70–99)

## 2013-01-11 LAB — COMPREHENSIVE METABOLIC PANEL
ALT: 12 U/L (ref 0–35)
AST: 17 U/L (ref 0–37)
Alkaline Phosphatase: 151 U/L — ABNORMAL HIGH (ref 39–117)
CO2: 22 mEq/L (ref 19–32)
Chloride: 102 mEq/L (ref 96–112)
GFR calc Af Amer: >90 mL/min (ref >90)
GFR calc non Af Amer: >90 mL/min (ref >90)
Glucose, Bld: 86 mg/dL (ref 70–99)
Potassium: 3.7 mEq/L (ref 3.7–5.3)
Sodium: 136 mEq/L — ABNORMAL LOW (ref 137–147)
Total Bilirubin: 0.4 mg/dL (ref 0.3–1.2)
Total Protein: 6.7 g/dL (ref 6.0–8.3)

## 2013-01-11 LAB — CBC
Hemoglobin: 9.7 g/dL — ABNORMAL LOW (ref 12.0–15.0)
MCH: 29.4 pg (ref 26.0–34.0)
MCH: 29.2 pg (ref 26.0–34.0)
MCH: 29.5 pg (ref 26.0–34.0)
MCHC: 33.3 g/dL (ref 30.0–36.0)
MCHC: 33.2 g/dL (ref 30.0–36.0)
MCHC: 33.2 g/dL (ref 30.0–36.0)
MCV: 87.8 fL (ref 78.0–100.0)
Platelets: 221 10*3/uL (ref 150–400)
Platelets: 206 10*3/uL (ref 150–400)
Platelets: 196 10*3/uL (ref 150–400)
RBC: 3.3 MIL/uL — ABNORMAL LOW (ref 3.87–5.11)
RBC: 3.43 MIL/uL — ABNORMAL LOW (ref 3.87–5.11)
RDW: 14 % (ref 11.5–15.5)
RDW: 13.5 % (ref 11.5–15.5)
WBC: 5.7 10*3/uL (ref 4.0–10.5)
WBC: 5.3 10*3/uL (ref 4.0–10.5)

## 2013-01-11 LAB — RPR: RPR Ser Ql: NONREACTIVE

## 2013-01-11 LAB — ABO/RH: ABO/RH(D): O POS

## 2013-01-11 MED ORDER — FENTANYL 2.5 MCG/ML BUPIVACAINE 1/10 % EPIDURAL INFUSION (WH - ANES)
14.0000 mL/h | INTRAMUSCULAR | Status: DC | PRN
  Administered 2013-01-11 (×2): 14 mL/h via EPIDURAL
  Filled 2013-01-11 (×2): qty 125

## 2013-01-11 MED ORDER — PHENYLEPHRINE 40 MCG/ML (10ML) SYRINGE FOR IV PUSH (FOR BLOOD PRESSURE SUPPORT)
80.0000 ug | PREFILLED_SYRINGE | INTRAVENOUS | Status: DC | PRN
  Filled 2013-01-11: qty 10
  Filled 2013-01-11: qty 2

## 2013-01-11 MED ORDER — LACTATED RINGERS IV SOLN
INTRAVENOUS | Status: DC
  Administered 2013-01-11 (×2): via INTRAVENOUS

## 2013-01-11 MED ORDER — LIDOCAINE HCL (PF) 1 % IJ SOLN
INTRAMUSCULAR | Status: DC | PRN
  Administered 2013-01-11 (×2): 5 mL

## 2013-01-11 MED ORDER — PHENYLEPHRINE 40 MCG/ML (10ML) SYRINGE FOR IV PUSH (FOR BLOOD PRESSURE SUPPORT)
80.0000 ug | PREFILLED_SYRINGE | INTRAVENOUS | Status: DC | PRN
  Filled 2013-01-11: qty 2

## 2013-01-11 MED ORDER — ACETAMINOPHEN 325 MG PO TABS
650.0000 mg | ORAL_TABLET | ORAL | Status: DC | PRN
  Administered 2013-01-11: 650 mg via ORAL
  Filled 2013-01-11 (×2): qty 2

## 2013-01-11 MED ORDER — LACTATED RINGERS IV SOLN: 500.0000 mL | INTRAVENOUS | Status: DC | PRN

## 2013-01-11 MED ORDER — CITRIC ACID-SODIUM CITRATE 334-500 MG/5ML PO SOLN: 30.0000 mL | ORAL | Status: DC | PRN

## 2013-01-11 MED ORDER — OXYTOCIN BOLUS FROM INFUSION: 500.0000 mL | INTRAVENOUS | Status: DC

## 2013-01-11 MED ORDER — OXYTOCIN 40 UNITS IN LACTATED RINGERS INFUSION - SIMPLE MED
1.0000 m[IU]/min | INTRAVENOUS | Status: DC
  Administered 2013-01-11: 2 m[IU]/min via INTRAVENOUS
  Filled 2013-01-11: qty 1000

## 2013-01-11 MED ORDER — OXYCODONE-ACETAMINOPHEN 5-325 MG PO TABS: 1.0000 | ORAL_TABLET | ORAL | Status: DC | PRN

## 2013-01-11 MED ORDER — LACTATED RINGERS IV SOLN: 500.0000 mL | Freq: Once | INTRAVENOUS | Status: DC

## 2013-01-11 MED ORDER — OXYTOCIN 40 UNITS IN LACTATED RINGERS INFUSION - SIMPLE MED
62.5000 mL/h | INTRAVENOUS | Status: DC
  Administered 2013-01-11: 62.5 mL/h via INTRAVENOUS

## 2013-01-11 MED ORDER — EPHEDRINE 5 MG/ML INJ
10.0000 mg | INTRAVENOUS | Status: DC | PRN
  Filled 2013-01-11: qty 2

## 2013-01-11 MED ORDER — ONDANSETRON HCL 4 MG/2ML IJ SOLN: 4.0000 mg | Freq: Four times a day (QID) | INTRAMUSCULAR | Status: DC | PRN

## 2013-01-11 MED ORDER — BUTORPHANOL TARTRATE 1 MG/ML IJ SOLN: 1.0000 mg | INTRAMUSCULAR | Status: DC | PRN

## 2013-01-11 MED ORDER — DIPHENHYDRAMINE HCL 50 MG/ML IJ SOLN: 12.5000 mg | INTRAMUSCULAR | Status: DC | PRN

## 2013-01-11 MED ORDER — IBUPROFEN 600 MG PO TABS
600.0000 mg | ORAL_TABLET | Freq: Four times a day (QID) | ORAL | Status: DC | PRN
  Administered 2013-01-11 – 2013-01-12 (×2): 600 mg via ORAL
  Filled 2013-01-11: qty 1

## 2013-01-11 MED ORDER — EPHEDRINE 5 MG/ML INJ
10.0000 mg | INTRAVENOUS | Status: DC | PRN
  Filled 2013-01-11: qty 2
  Filled 2013-01-11: qty 4

## 2013-01-11 MED ORDER — BUTALBITAL-APAP-CAFFEINE 50-325-40 MG PO TABS
2.0000 | ORAL_TABLET | ORAL | Status: DC | PRN
  Administered 2013-01-11 (×2): 2 via ORAL
  Filled 2013-01-11 (×2): qty 2

## 2013-01-11 MED ORDER — LIDOCAINE HCL (PF) 1 % IJ SOLN
30.0000 mL | INTRAMUSCULAR | Status: DC | PRN
  Filled 2013-01-11 (×2): qty 30

## 2013-01-11 MED ORDER — TERBUTALINE SULFATE 1 MG/ML IJ SOLN: 0.2500 mg | Freq: Once | INTRAMUSCULAR | Status: AC | PRN

## 2013-01-11 NOTE — H&P (Signed)
This is Dr. Francoise Ceo dictating the history and physical on  Mary Richardson she's a 42 year old gravida 2 para 0101 at 39 weeks and and they she's a diabetic on glyburide 2.5 mg daily and her blood sugar an admission was sent to 5 and she was brought in for induction she's been followed by MFM for her diabetes she has also a positive hepatitis be screen her cervix is now 1 cm 90% vertex -3 amniotomy performed the fluids clear she is on low-dose Pitocin Past medical history she is from [place] she doesn't remember ever contracting hepatitis B but she's not Past surgical history negative System review noncontributory Physical exam well-developed female in labor HEENT negative Lungs clear to P&A Heart regular rhythm no murmurs no gallops Abdomen term Pelvic as described above Extremities negative and

## 2013-01-11 NOTE — Anesthesia Procedure Notes (Signed)
Epidural Patient location during procedure: OB Start time: 01/11/2013 2:30 PM  Staffing Anesthesiologist: Brayton Caves Performed by: anesthesiologist   Preanesthetic Checklist Completed: patient identified, site marked, surgical consent, pre-op evaluation, timeout performed, IV checked, risks and benefits discussed and monitors and equipment checked  Epidural Patient position: sitting Prep: site prepped and draped and DuraPrep Patient monitoring: continuous pulse ox and blood pressure Approach: midline Injection technique: LOR air  Needle:  Needle type: Tuohy  Needle gauge: 17 G Needle length: 9 cm and 9 Needle insertion depth: 7 cm Catheter type: closed end flexible Catheter size: 19 Gauge Catheter at skin depth: 14 cm Test dose: negative  Assessment Events: blood not aspirated, injection not painful, no injection resistance, negative IV test and no paresthesia  Additional Notes Patient identified.  Risk benefits discussed including failed block, incomplete pain control, headache, nerve damage, paralysis, blood pressure changes, nausea, vomiting, reactions to medication both toxic or allergic, and postpartum back pain.  Patient expressed understanding and wished to proceed.  All questions were answered.  Sterile technique used throughout procedure and epidural site dressed with sterile barrier dressing. No paresthesia or other complications noted.The patient did not experience any signs of intravascular injection such as tinnitus or metallic taste in mouth nor signs of intrathecal spread such as rapid motor block. Please see nursing notes for vital signs.

## 2013-01-11 NOTE — Anesthesia Preprocedure Evaluation (Signed)
Anesthesia Evaluation  Patient identified by MRN, date of birth, ID band Patient awake    Reviewed: Allergy & Precautions, H&P , Patient's Chart, lab work & pertinent test results  Airway Mallampati: III TM Distance: >3 FB Neck ROM: full    Dental   Pulmonary  breath sounds clear to auscultation        Cardiovascular hypertension, Rhythm:regular Rate:Normal     Neuro/Psych    GI/Hepatic (+) Hepatitis -  Endo/Other  diabetesMorbid obesity  Renal/GU      Musculoskeletal   Abdominal   Peds  Hematology   Anesthesia Other Findings   Reproductive/Obstetrics (+) Pregnancy                           Anesthesia Physical Anesthesia Plan  ASA: III  Anesthesia Plan: Epidural   Post-op Pain Management:    Induction:   Airway Management Planned:   Additional Equipment:   Intra-op Plan:   Post-operative Plan:   Informed Consent: I have reviewed the patients History and Physical, chart, labs and discussed the procedure including the risks, benefits and alternatives for the proposed anesthesia with the patient or authorized representative who has indicated his/her understanding and acceptance.     Plan Discussed with:   Anesthesia Plan Comments:         Anesthesia Quick Evaluation

## 2013-01-12 LAB — CBC
HCT: 26.6 % — ABNORMAL LOW (ref 36.0–46.0)
Platelets: 189 10*3/uL (ref 150–400)
RBC: 3.04 MIL/uL — ABNORMAL LOW (ref 3.87–5.11)
RDW: 13.9 % (ref 11.5–15.5)
WBC: 9 10*3/uL (ref 4.0–10.5)

## 2013-01-12 MED ORDER — SENNOSIDES-DOCUSATE SODIUM 8.6-50 MG PO TABS
2.0000 | ORAL_TABLET | ORAL | Status: DC
  Administered 2013-01-12 – 2013-01-13 (×2): 2 via ORAL
  Filled 2013-01-12 (×2): qty 2

## 2013-01-12 MED ORDER — TETANUS-DIPHTH-ACELL PERTUSSIS 5-2.5-18.5 LF-MCG/0.5 IM SUSP
0.5000 mL | Freq: Once | INTRAMUSCULAR | Status: AC
  Administered 2013-01-12: 0.5 mL via INTRAMUSCULAR
  Filled 2013-01-12: qty 0.5

## 2013-01-12 MED ORDER — ONDANSETRON HCL 4 MG/2ML IJ SOLN: 4.0000 mg | INTRAMUSCULAR | Status: DC | PRN

## 2013-01-12 MED ORDER — DIPHENHYDRAMINE HCL 25 MG PO CAPS: 25.0000 mg | ORAL_CAPSULE | Freq: Four times a day (QID) | ORAL | Status: DC | PRN

## 2013-01-12 MED ORDER — PRENATAL MULTIVITAMIN CH
1.0000 | ORAL_TABLET | Freq: Every day | ORAL | Status: DC
  Administered 2013-01-12 – 2013-01-13 (×2): 1 via ORAL
  Filled 2013-01-12 (×2): qty 1

## 2013-01-12 MED ORDER — OXYCODONE-ACETAMINOPHEN 5-325 MG PO TABS: 1.0000 | ORAL_TABLET | ORAL | Status: DC | PRN

## 2013-01-12 MED ORDER — DIBUCAINE 1 % RE OINT: 1.0000 "application " | TOPICAL_OINTMENT | RECTAL | Status: DC | PRN

## 2013-01-12 MED ORDER — BENZOCAINE-MENTHOL 20-0.5 % EX AERO: 1.0000 "application " | INHALATION_SPRAY | CUTANEOUS | Status: DC | PRN

## 2013-01-12 MED ORDER — LANOLIN HYDROUS EX OINT: TOPICAL_OINTMENT | CUTANEOUS | Status: DC | PRN

## 2013-01-12 MED ORDER — ONDANSETRON HCL 4 MG PO TABS: 4.0000 mg | ORAL_TABLET | ORAL | Status: DC | PRN

## 2013-01-12 MED ORDER — SIMETHICONE 80 MG PO CHEW: 80.0000 mg | CHEWABLE_TABLET | ORAL | Status: DC | PRN

## 2013-01-12 MED ORDER — ZOLPIDEM TARTRATE 5 MG PO TABS: 5.0000 mg | ORAL_TABLET | Freq: Every evening | ORAL | Status: DC | PRN

## 2013-01-12 MED ORDER — FERROUS SULFATE 325 (65 FE) MG PO TABS
325.0000 mg | ORAL_TABLET | Freq: Two times a day (BID) | ORAL | Status: DC
  Administered 2013-01-12 – 2013-01-13 (×3): 325 mg via ORAL
  Filled 2013-01-12 (×3): qty 1

## 2013-01-12 MED ORDER — WITCH HAZEL-GLYCERIN EX PADS: 1.0000 "application " | MEDICATED_PAD | CUTANEOUS | Status: DC | PRN

## 2013-01-12 MED ORDER — IBUPROFEN 600 MG PO TABS
600.0000 mg | ORAL_TABLET | Freq: Four times a day (QID) | ORAL | Status: DC
  Administered 2013-01-12 – 2013-01-13 (×5): 600 mg via ORAL
  Filled 2013-01-12 (×6): qty 1

## 2013-01-12 NOTE — Progress Notes (Signed)
UR chart review completed.  

## 2013-01-12 NOTE — Progress Notes (Signed)
Clinical Social Work Department PSYCHOSOCIAL ASSESSMENT - MATERNAL/CHILD 01/12/2013  Patient:  Mary Richardson,Mary Richardson  Account Number:  [id number]  Admit Date:  01/11/2013  Childs Name:   Mary Richardson    Clinical Social Worker:  Eulala Newcombe, LCSW   Date/Time:  01/12/2013 09:45 AM  Date Referred:  01/12/2013   Referral source  Physician     Referred reason  Other - See comment   Other referral source:   Carseat needs    I:  FAMILY / HOME ENVIRONMENT Child's legal guardian:  PARENT  Guardian - Name Guardian - Age Guardian - Address  Mary Richardson 42 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   Financial resources:  Self Pay If Medicaid - County:   Other  WIC   School / Grade:   Maternity Care Coordinator / Child Services Coordination / Early Interventions:   MOB reports that she was unable to receive Medicaid because she is not a citizen.  Cultural issues impacting care:   Patient moved from [place] and has limited supports.    III  STRENGTHS Strengths  Supportive family/friends   Strength comment:  MOB reports that sister will assist as needed.   IV  RISK FACTORS AND CURRENT PROBLEMS Current Problem:  YES   Risk Factor & Current Problem Patient Issue Family Issue Risk Factor / Current Problem Comment  Financial Resources Y Y MOB reports limited income    V  SOCIAL WORK ASSESSMENT CSW received referral due to MOB not having a car seat. CSW met with MOB and baby at bedside. CSW introduced myself and explained role.    MOB reports that this is her 2nd child and has a 4 year old at home. MOB reports that FOB is not involved and limited family support due to move from [place]. MOB reports  that her sister will assist as needed and is helping with supplies such as a basinet.  MOB inquired about a car seat. CSW explained $30 charge for car seat and MOB reports she has $25 and sister will provide $5. CSW spoke with AC who reports once MOB has money at hospital then they can bring car seat.    MOB reports that son has Medicaid but she was not eligible. MOB is already active with WIC and understanding that she needs to alert WIC regarding baby's arrival. MOB inquired about assistance for formula and CSW informed MOB that WIC could assist.    CSW will continue to follow to assist with resources.      VI SOCIAL WORK PLAN Social Work Plan  Psychosocial Support/Ongoing Assessment of Needs   Type of pt/family education:   Car Seat program at hospital   If child protective services report - county:   If child protective services report - date:   Information/referral to community resources comment:   WIC   Other social work plan:   CSW will assist with car seat needs and other resources.    Jadin Kagel, LCSW  (Coverage for Tedra Slade) 

## 2013-01-12 NOTE — Anesthesia Postprocedure Evaluation (Signed)
  Anesthesia Post-op Note  Patient: Mary Richardson  Procedure(s) Performed: * No procedures listed *  Patient Location: Mother/Baby  Anesthesia Type:Epidural  Level of Consciousness: awake, oriented and patient cooperative  Airway and Oxygen Therapy: Patient Spontanous Breathing  Post-op Pain: none  Post-op Assessment: Patient's Cardiovascular Status Stable, Respiratory Function Stable, Patent Airway, No signs of Nausea or vomiting, Adequate PO intake, Pain level controlled, No headache, No backache, No residual numbness and No residual motor weakness  Post-op Vital Signs: Reviewed and stable  Complications: No apparent anesthesia complications

## 2013-01-12 NOTE — Progress Notes (Signed)
Patient ID: Mary Richardson, female   DOB: 10-21-1970, 42 y.o.   MRN: [id number] Postpartum day one Vital signs normal Fundus firm Legs negative Doing well

## 2013-01-13 NOTE — Discharge Summary (Signed)
Obstetric Discharge Summary Reason for Admission: induction of labor Prenatal Procedures: none Intrapartum Procedures: spontaneous vaginal delivery Postpartum Procedures: none Complications-Operative and Postpartum: none Hemoglobin  Date Value Range Status  01/12/2013 8.8* 12.0 - 15.0 g/dL Final     HCT  Date Value Range Status  01/12/2013 26.6* 36.0 - 46.0 % Final    Physical Exam:  General: alert Lochia: appropriate Uterine Fundus: firm Incision: healing well DVT Evaluation: No evidence of DVT seen on physical exam.  Discharge Diagnoses: Term Pregnancy-delivered  Discharge Information: Date: 01/13/2013 Activity: pelvic rest Diet:  Medications: Percocet Condition: stable Instructions: refer to practice specific booklet Discharge to: home Follow-up Information   Follow up with Kathreen CosierMARSHALL,BERNARD A, MD.   Specialty:  Obstetrics and Gynecology   Contact information:   [address] [place] [address] [phone]       Newborn Data: Live born female  Birth Weight: 6 lb 2.4 oz (2790 g) APGAR: 5, 7  Home with mother.  MARSHALL,BERNARD A 01/13/2013, 8:19 AM

## 2013-01-13 NOTE — Discharge Instructions (Signed)
Discharge instructions ° °· You can wash your hair °· Shower °· Eat what you want °· Drink what you want °· See me in 6 weeks °· Your ankles are going to swell more in the next 2 weeks than when pregnant °· No sex for 6 weeks ° ° °Arvetta Araque A, MD 01/13/2013 ° ° °

## 2013-02-01 ENCOUNTER — Emergency Department (HOSPITAL_COMMUNITY): Payer: Medicaid Other

## 2013-02-01 ENCOUNTER — Observation Stay (HOSPITAL_COMMUNITY)
Admission: EM | Admit: 2013-02-01 | Discharge: 2013-02-02 | Disposition: A | Discharge status: Home / Self Care | Payer: Medicaid Other | Attending: Internal Medicine | Admitting: Internal Medicine

## 2013-02-01 DIAGNOSIS — R51 Headache: Principal | ICD-10-CM | POA: Insufficient documentation

## 2013-02-01 DIAGNOSIS — I10 Essential (primary) hypertension: Secondary | ICD-10-CM | POA: Insufficient documentation

## 2013-02-01 DIAGNOSIS — R519 Headache, unspecified: Secondary | ICD-10-CM | POA: Diagnosis present

## 2013-02-01 DIAGNOSIS — J45909 Unspecified asthma, uncomplicated: Secondary | ICD-10-CM | POA: Insufficient documentation

## 2013-02-01 DIAGNOSIS — B1910 Unspecified viral hepatitis B without hepatic coma: Secondary | ICD-10-CM | POA: Insufficient documentation

## 2013-02-01 DIAGNOSIS — H538 Other visual disturbances: Secondary | ICD-10-CM

## 2013-02-01 HISTORY — DX: Headache: R51

## 2013-02-01 LAB — PROTIME-INR
INR: 1.03 (ref 0.00–1.49)
Prothrombin Time: 13.3 seconds (ref 11.6–15.2)

## 2013-02-01 LAB — CBC WITH DIFFERENTIAL/PLATELET
BASOS ABS: 0.1 10*3/uL (ref 0.0–0.1)
Basophils Relative: 1 % (ref 0–1)
EOS ABS: 0.3 10*3/uL (ref 0.0–0.7)
EOS PCT: 4 % (ref 0–5)
HCT: 36.6 % (ref 36.0–46.0)
Hemoglobin: 12.1 g/dL (ref 12.0–15.0)
LYMPHS PCT: 41 % (ref 12–46)
Lymphs Abs: 2.8 10*3/uL (ref 0.7–4.0)
MCH: 29.4 pg (ref 26.0–34.0)
MCHC: 33.1 g/dL (ref 30.0–36.0)
MCV: 88.8 fL (ref 78.0–100.0)
Monocytes Absolute: 0.6 10*3/uL (ref 0.1–1.0)
Monocytes Relative: 8 % (ref 3–12)
Neutro Abs: 3.1 10*3/uL (ref 1.7–7.7)
Neutrophils Relative %: 45 % (ref 43–77)
PLATELETS: 312 10*3/uL (ref 150–400)
RBC: 4.12 MIL/uL (ref 3.87–5.11)
RDW: 13 % (ref 11.5–15.5)
WBC: 6.9 10*3/uL (ref 4.0–10.5)

## 2013-02-01 LAB — APTT: aPTT: 29 seconds (ref 24–37)

## 2013-02-01 LAB — COMPREHENSIVE METABOLIC PANEL
ALT: 26 U/L (ref 0–35)
AST: 22 U/L (ref 0–37)
Albumin: 3.5 g/dL (ref 3.5–5.2)
Alkaline Phosphatase: 111 U/L (ref 39–117)
BUN: 11 mg/dL (ref 6–23)
CALCIUM: 9.2 mg/dL (ref 8.4–10.5)
CO2: 25 mEq/L (ref 19–32)
Chloride: 106 mEq/L (ref 96–112)
Creatinine, Ser: 0.71 mg/dL (ref 0.50–1.10)
GFR calc Af Amer: >90 mL/min (ref >90)
GFR calc non Af Amer: >90 mL/min (ref >90)
Glucose, Bld: 95 mg/dL (ref 70–99)
Potassium: 3.6 mEq/L — ABNORMAL LOW (ref 3.7–5.3)
SODIUM: 145 meq/L (ref 137–147)
TOTAL PROTEIN: 8.2 g/dL (ref 6.0–8.3)
Total Bilirubin: 0.3 mg/dL (ref 0.3–1.2)

## 2013-02-01 LAB — GLUCOSE, CAPILLARY: GLUCOSE-CAPILLARY: 84 mg/dL (ref 70–99)

## 2013-02-01 LAB — POCT PREGNANCY, URINE: Preg Test, Ur: NEGATIVE

## 2013-02-01 MED ORDER — LIDOCAINE-EPINEPHRINE 1 %-1:100000 IJ SOLN
10.0000 mL | Freq: Once | INTRAMUSCULAR | Status: AC
  Administered 2013-02-01: 10 mL via INTRADERMAL
  Filled 2013-02-01: qty 1

## 2013-02-01 MED ORDER — SODIUM CHLORIDE 0.9 % IV SOLN: 1.0000 mg/h | Freq: Once | INTRAVENOUS | Status: DC

## 2013-02-01 MED ORDER — DEXAMETHASONE SODIUM PHOSPHATE 10 MG/ML IJ SOLN
10.0000 mg | Freq: Once | INTRAMUSCULAR | Status: AC
  Administered 2013-02-01: 10 mg via INTRAVENOUS
  Filled 2013-02-01: qty 1

## 2013-02-01 MED ORDER — ONDANSETRON HCL 4 MG/2ML IJ SOLN
4.0000 mg | Freq: Once | INTRAMUSCULAR | Status: AC
  Administered 2013-02-01: 4 mg via INTRAVENOUS
  Filled 2013-02-01: qty 2

## 2013-02-01 MED ORDER — HYDROMORPHONE HCL PF 1 MG/ML IJ SOLN
1.0000 mg | Freq: Once | INTRAMUSCULAR | Status: AC
  Administered 2013-02-01: 1 mg via INTRAVENOUS
  Filled 2013-02-01: qty 1

## 2013-02-01 MED ORDER — MAGNESIUM SULFATE 40 MG/ML IJ SOLN: 2.0000 g | Freq: Once | INTRAMUSCULAR | Status: DC

## 2013-02-01 MED ORDER — LABETALOL HCL 5 MG/ML IV SOLN
INTRAVENOUS | Status: AC
  Administered 2013-02-01: 10 mg
  Filled 2013-02-01: qty 4

## 2013-02-01 NOTE — Consult Note (Signed)
Referring Physician: ED    Chief Complaint: CODE STROKE: HA, BLURRED VISION  HPI:                                                                                                                                         Mary Richardson is an 43 y.o. female, right handed, with a past medical history significant for episodic migraine without aura, preeclampsia with previous pregnancy 4 years ago, 3 weeks post partum, brought in by ambulance as a code stroke due to acute onset HA and blurred vision. Last known well at 845 pm tonight. She said that she was driving and suddenly developed a severe, pounding HA associated with burred vision but no nausea, vomiting, focal weakness or numbness, slurred speech, confusion, or language impairment. She called ambulance and her initial evaluation by EMS revealed SBP 220. No recent head or neck trauma, fever, or infection. Unenhanced CT brain showed no acute abnormality. Received 10 mg IV labetalol in the ED and SBP 160-170 range.  Date last known well: 02/01/13 Time last known well: 845 pm  tPA Given: no, NIHSS 0 NIHSS: 0 MRS: 0  Past Medical History  Diagnosis Date  . Hypertension   . Asthma   . Gestational diabetes     glyburide  . Hepatitis B     No past surgical history on file.  Family History  Problem Relation Age of Onset  . Diabetes Father   . Hypertension Father   . Stroke Father   . Asthma Brother   . Asthma Son   . Hearing loss Maternal Aunt   . Alcohol abuse Neg Hx   . Arthritis Neg Hx   . Birth defects Neg Hx   . Cancer Neg Hx   . COPD Neg Hx   . Depression Neg Hx   . Drug abuse Neg Hx   . Early death Neg Hx   . Heart disease Neg Hx   . Hyperlipidemia Neg Hx   . Kidney disease Neg Hx   . Learning disabilities Neg Hx   . Mental illness Neg Hx   . Mental retardation Neg Hx   . Miscarriages / Stillbirths Neg Hx   . Vision loss Neg Hx    Social History:  reports that she has never smoked. She has never used smokeless  tobacco. She reports that she does not drink alcohol or use illicit drugs.  Allergies: No Known Allergies  Medications:  I have reviewed the patient's current medications.  ROS:                                                                                                                                       History obtained from the patient and chart review.  General ROS: negative for - chills, fatigue, fever, night sweats, weight gain or weight loss Psychological ROS: negative for - behavioral disorder, hallucinations, memory difficulties, mood swings or suicidal ideation Ophthalmic ROS: negative for - double vision, eye pain or loss of vision ENT ROS: negative for - epistaxis, nasal discharge, oral lesions, sore throat, tinnitus or vertigo Allergy and Immunology ROS: negative for - hives or itchy/watery eyes Hematological and Lymphatic ROS: negative for - bleeding problems, bruising or swollen lymph nodes Endocrine ROS: negative for - galactorrhea, hair pattern changes, polydipsia/polyuria or temperature intolerance Respiratory ROS: negative for - cough, hemoptysis, shortness of breath or wheezing Cardiovascular ROS: negative for - chest pain, dyspnea on exertion, edema or irregular heartbeat Gastrointestinal ROS: negative for - abdominal pain, diarrhea, hematemesis, nausea/vomiting or stool incontinence Genito-Urinary ROS: negative for - dysuria, hematuria, incontinence or urinary frequency/urgency Musculoskeletal ROS: negative for - joint swelling or muscular weakness Neurological ROS: as noted in HPI Dermatological ROS: negative for rash and skin lesion changes   Physical exam: pleasant female in no apparent distress. Blood pressure 176/103, pulse 62, temperature 99.1 F (37.3 C), resp. rate 15, SpO2 100.00%. Head: normocephalic. Neck: supple, no bruits, no  JVD. Cardiac: no murmurs. Lungs: clear. Abdomen: soft, no tender, no mass. Extremities: no edema.  Neurologic Examination:                                                                                                      Mental Status: Alert, oriented, thought content appropriate.  Speech fluent without evidence of aphasia.  Able to follow 3 step commands without difficulty. Cranial Nerves: II: Discs flat bilaterally; Visual fields grossly normal, pupils equal, round, reactive to light and accommodation III,IV, VI: ptosis not present, extra-ocular motions intact bilaterally V,VII: smile symmetric, facial light touch sensation normal bilaterally VIII: hearing normal bilaterally IX,X: gag reflex present XI: bilateral shoulder shrug XII: midline tongue extension without atrophy or fasciculations  Motor: Right : Upper extremity   5/5    Left:     Upper extremity   5/5  Lower extremity   5/5     Lower extremity   5/5 Tone and bulk:normal tone throughout; no atrophy noted Sensory:  Pinprick and light touch intact throughout, bilaterally Deep Tendon Reflexes:  Right: Upper Extremity   Left: Upper extremity   biceps (C-5 to C-6) 2/4   biceps (C-5 to C-6) 2/4 tricep (C7) 2/4    triceps (C7) 2/4 Brachioradialis (C6) 2/4  Brachioradialis (C6) 2/4  Lower Extremity Lower Extremity  quadriceps (L-2 to L-4) 2/4   quadriceps (L-2 to L-4) 2/4 Achilles (S1) 2/4   Achilles (S1) 2/4  Plantars: Right: downgoing   Left: downgoing Cerebellar: normal finger-to-nose,  normal heel-to-shin test Gait: No tested. CV: pulses palpable throughout     Results for orders placed during the hospital encounter of 02/01/13 (from the past 48 hour(s))  CBC WITH DIFFERENTIAL     Status: None   Collection Time    02/01/13  9:18 PM      Result Value Range   WBC 6.9  4.0 - 10.5 K/uL   RBC 4.12  3.87 - 5.11 MIL/uL   Hemoglobin 12.1  12.0 - 15.0 g/dL   HCT 65.736.6  84.636.0 - 96.246.0 %   MCV 88.8  78.0 - 100.0 fL    MCH 29.4  26.0 - 34.0 pg   MCHC 33.1  30.0 - 36.0 g/dL   RDW 95.213.0  84.111.5 - 32.415.5 %   Platelets 312  150 - 400 K/uL   Neutrophils Relative % 45  43 - 77 %   Neutro Abs 3.1  1.7 - 7.7 K/uL   Lymphocytes Relative 41  12 - 46 %   Lymphs Abs 2.8  0.7 - 4.0 K/uL   Monocytes Relative 8  3 - 12 %   Monocytes Absolute 0.6  0.1 - 1.0 K/uL   Eosinophils Relative 4  0 - 5 %   Eosinophils Absolute 0.3  0.0 - 0.7 K/uL   Basophils Relative 1  0 - 1 %   Basophils Absolute 0.1  0.0 - 0.1 K/uL   Ct Head (brain) Wo Contrast  02/01/2013   CLINICAL DATA:  Headache and blurred vision.  EXAM: CT HEAD WITHOUT CONTRAST  TECHNIQUE: Contiguous axial images were obtained from the base of the skull through the vertex without intravenous contrast.  COMPARISON:  None.  FINDINGS: Sinuses/Soft tissues: Clear paranasal sinuses and mastoid air cells.  Intracranial: No mass lesion, hemorrhage, hydrocephalus, acute infarct, intra-axial, or extra-axial fluid collection.  IMPRESSION: Normal head CT.  These results were called by telephone at the time of interpretation on 02/01/2013 at 9:30 PM to neurology MD, who verbally acknowledged these results.   Electronically Signed   By: Jeronimo GreavesKyle  Talbot M.D.   On: 02/01/2013 21:31    Assessment: 43 y.o. female brought in with acute onset very severe HA and blurred vision. Initial SBP 220 by EMS, improved after receiving 10 mg IV labetalol.Marland Kitchen. NIHSS 0. CT brain unremarkable. Possible hypertensive emergency related HA. She has a history of migraine but said that this is a different type of HA. CT brain very sensitive for SAH in the first 6 hours, but will recommend LP to exclude SAH. Dilaudid for HA control. Will follow up.  Wyatt Portelasvaldo Reilley Latorre, MD [facility] [phone]  02/01/2013, 9:36 PM

## 2013-02-01 NOTE — Code Documentation (Signed)
Patient driving and developed sudden onset headache and blurred vision at 2045. EMS was dispatched, [facility] complaining of frontal headache and blurred vision. Patient's SBP 220s [facility]. Patient has history of migraines and preeclampsia with previous child. Patient is 3 weeks postpartum. Upon arrival to patient's room, patient complains of frontal headache, denies blurred vision, and complains of ringing in ears. NIH 0. Code Stroke cancelled per Dr. Gustavo Lahamlio

## 2013-02-01 NOTE — ED Notes (Signed)
Neurologist remains at bedside

## 2013-02-01 NOTE — ED Notes (Signed)
EMS-pt was driving her car when she had sudden onset of headache and blurred vision. On ems arrival code stroke initiated due to symptoms. 20g(L)Rhand. Pt is 3 weeks post delivery. Blood pressure 220 systolic en route.

## 2013-02-01 NOTE — ED Notes (Signed)
Assessment completed at [facility] by Dr Rolland BimlerYlverton. Airway cleared and lab drawn. Pt to CT with RN and RR RN.

## 2013-02-01 NOTE — ED Notes (Signed)
Pt to room. Placed on monitor and in gown. Pt using bedpan at this time. Remains laying flat. Pts only complaint at this time is frontal headache. Denies blurred vision.

## 2013-02-02 ENCOUNTER — Observation Stay (HOSPITAL_COMMUNITY): Payer: Medicaid Other

## 2013-02-02 ENCOUNTER — Encounter (HOSPITAL_COMMUNITY): Payer: Self-pay | Admitting: General Practice

## 2013-02-02 DIAGNOSIS — R519 Headache, unspecified: Secondary | ICD-10-CM | POA: Diagnosis present

## 2013-02-02 DIAGNOSIS — I10 Essential (primary) hypertension: Secondary | ICD-10-CM

## 2013-02-02 DIAGNOSIS — R51 Headache: Secondary | ICD-10-CM

## 2013-02-02 LAB — GRAM STAIN

## 2013-02-02 LAB — PROTEIN, CSF: Total  Protein, CSF: 16 mg/dL (ref 15–45)

## 2013-02-02 LAB — CSF CELL COUNT WITH DIFFERENTIAL
RBC COUNT CSF: 1 /mm3 — AB
RBC Count, CSF: 2 /mm3 — ABNORMAL HIGH
TUBE #: 1
TUBE #: 4
WBC CSF: 1 /mm3 (ref 0–5)
WBC, CSF: 0 /mm3 (ref 0–5)

## 2013-02-02 LAB — GLUCOSE, CSF: Glucose, CSF: 64 mg/dL (ref 43–76)

## 2013-02-02 MED ORDER — METOCLOPRAMIDE HCL 5 MG/ML IJ SOLN
10.0000 mg | Freq: Three times a day (TID) | INTRAMUSCULAR | Status: DC
  Administered 2013-02-02: 10 mg via INTRAVENOUS
  Filled 2013-02-02 (×5): qty 2

## 2013-02-02 MED ORDER — SODIUM CHLORIDE 0.9 % IV SOLN
1000.0000 mg | Freq: Once | INTRAVENOUS | Status: AC
  Administered 2013-02-02: 1000 mg via INTRAVENOUS
  Filled 2013-02-02: qty 8

## 2013-02-02 MED ORDER — METOCLOPRAMIDE HCL 5 MG/ML IJ SOLN
10.0000 mg | Freq: Three times a day (TID) | INTRAMUSCULAR | Status: DC | PRN
  Filled 2013-02-02: qty 2

## 2013-02-02 MED ORDER — VALPROATE SODIUM 500 MG/5ML IV SOLN
500.0000 mg | Freq: Two times a day (BID) | INTRAVENOUS | Status: DC
  Administered 2013-02-02 (×2): 500 mg via INTRAVENOUS
  Filled 2013-02-02 (×3): qty 5

## 2013-02-02 MED ORDER — ACETAMINOPHEN 325 MG PO TABS: 650.0000 mg | ORAL_TABLET | Freq: Four times a day (QID) | ORAL | Status: DC | PRN

## 2013-02-02 MED ORDER — IOHEXOL 350 MG/ML SOLN
100.0000 mL | Freq: Once | INTRAVENOUS | Status: AC | PRN
  Administered 2013-02-02: 50 mL via INTRAVENOUS

## 2013-02-02 MED ORDER — ACETAMINOPHEN 500 MG PO TABS
1000.0000 mg | ORAL_TABLET | Freq: Once | ORAL | Status: AC
  Administered 2013-02-02: 1000 mg via ORAL
  Filled 2013-02-02: qty 2

## 2013-02-02 MED ORDER — IBUPROFEN 200 MG PO TABS: 800.0000 mg | ORAL_TABLET | Freq: Three times a day (TID) | ORAL | Status: DC | PRN

## 2013-02-02 MED ORDER — VERAPAMIL HCL 40 MG PO TABS
40.0000 mg | ORAL_TABLET | Freq: Two times a day (BID) | ORAL | Status: DC
  Administered 2013-02-02: 40 mg via ORAL
  Filled 2013-02-02 (×2): qty 1

## 2013-02-02 MED ORDER — KETOROLAC TROMETHAMINE 30 MG/ML IJ SOLN
30.0000 mg | Freq: Four times a day (QID) | INTRAMUSCULAR | Status: DC | PRN
  Administered 2013-02-02: 30 mg via INTRAVENOUS
  Filled 2013-02-02 (×2): qty 1

## 2013-02-02 MED ORDER — LABETALOL HCL 5 MG/ML IV SOLN
10.0000 mg | INTRAVENOUS | Status: DC | PRN
  Filled 2013-02-02: qty 4

## 2013-02-02 MED ORDER — KETOROLAC TROMETHAMINE 15 MG/ML IJ SOLN: 15.0000 mg | Freq: Once | INTRAMUSCULAR | Status: DC

## 2013-02-02 MED ORDER — VERAPAMIL HCL 40 MG PO TABS: 40.0000 mg | ORAL_TABLET | Freq: Two times a day (BID) | ORAL | Status: DC

## 2013-02-02 MED ORDER — MAGNESIUM SULFATE 40 MG/ML IJ SOLN
2.0000 g | Freq: Once | INTRAMUSCULAR | Status: AC
  Administered 2013-02-02: 2 g via INTRAVENOUS
  Filled 2013-02-02: qty 50

## 2013-02-02 NOTE — ED Notes (Signed)
Depacon IV drip finished right before the pt was discharge.

## 2013-02-02 NOTE — ED Notes (Addendum)
Per NT, NT helped pt up to bathroom per this RN's permission, pt reported increased dizziness when ambulating and NT reports the pt had to lean up against the wall because the pt thought she was going to fall. MD informed.

## 2013-02-02 NOTE — ED Notes (Signed)
Pt reports her HTN is a new onset today, pt reports she had a hx of pregnancy induced HTN with her first pregnancy, pt reports her BP was controlled and normal during her recent pregnancy. Pt states she believes her BP is elevated today because of stress and from no sleep.

## 2013-02-02 NOTE — Progress Notes (Addendum)
NURSING PROGRESS NOTE  Delorise Jacksondeye Mary Richardson [id number] Discharge Data: 02/02/2013 8:02 PM Attending Provider: Maretta BeesShanker M Ghimire, MD UXL:[id number]:MARSHALL,BERNARD A, MD     Chester HolsteinNdeye Mary Richardson to be D/C'd Home per MD order.  Discussed with the patient the After Visit Summary and all questions fully answered. All IV's discontinued with no bleeding noted. All belongings returned to patient for patient to take home. Patient was given her prescription for her new medication and instructed not to breast-feed while taking the medication.   Last Vital Signs:  Blood pressure 153/85, pulse 65, temperature 97.9 F (36.6 C), temperature source Oral, resp. rate 18, height 5\' 5"  (1.651 m), weight 96.934 kg (213 lb 11.2 oz), SpO2 97.00%.  Discharge Medication List   Medication List    STOP taking these medications       oxyCODONE-acetaminophen 5-325 MG per tablet  Commonly known as:  PERCOCET/ROXICET      TAKE these medications       ibuprofen 200 MG tablet  Commonly known as:  ADVIL,MOTRIN  Take 4 tablets (800 mg total) by mouth every 8 (eight) hours as needed for headache or moderate pain.     verapamil 40 MG tablet  Commonly known as:  CALAN  Take 1 tablet (40 mg total) by mouth every 12 (twelve) hours.

## 2013-02-02 NOTE — H&P (Signed)
[facility] History and Physical  Mary Richardson HQI:[id number] DOB: 08-Aug-1970 DOA: 02/01/2013  Referring physician: EDP PCP: Kathreen CosierMARSHALL,BERNARD A, MD   Chief Complaint: Headache   HPI: Mary Richardson is a 43 y.o. female who initially presented to the ED with blurred vision, HTN, and severe headache.  She was evaluated by neurology as a code stroke but determined to be just having tension headache.  Her HTN has improved but her severe headache continues, as a result hospitalist has been asked to admit for intractable headache.  Review of Systems: Systems reviewed.  As above, otherwise negative  Past Medical History  Diagnosis Date  . Hypertension   . Asthma   . Gestational diabetes     glyburide  . Hepatitis B    No past surgical history on file. Social History:  reports that she has never smoked. She has never used smokeless tobacco. She reports that she does not drink alcohol or use illicit drugs.  No Known Allergies  Family History  Problem Relation Age of Onset  . Diabetes Father   . Hypertension Father   . Stroke Father   . Asthma Brother   . Asthma Son   . Hearing loss Maternal Aunt   . Alcohol abuse Neg Hx   . Arthritis Neg Hx   . Birth defects Neg Hx   . Cancer Neg Hx   . COPD Neg Hx   . Depression Neg Hx   . Drug abuse Neg Hx   . Early death Neg Hx   . Heart disease Neg Hx   . Hyperlipidemia Neg Hx   . Kidney disease Neg Hx   . Learning disabilities Neg Hx   . Mental illness Neg Hx   . Mental retardation Neg Hx   . Miscarriages / Stillbirths Neg Hx   . Vision loss Neg Hx      Prior to Admission medications   Medication Sig Start Date End Date Taking? Authorizing Provider  ibuprofen (ADVIL,MOTRIN) 200 MG tablet Take 400 mg by mouth every 6 (six) hours as needed for moderate pain.   Yes Historical Provider, MD  oxyCODONE-acetaminophen (PERCOCET/ROXICET) 5-325 MG per tablet Take 1 tablet by mouth every 4 (four) hours as needed for severe pain.    Yes Historical Provider, MD   Physical Exam: Filed Vitals:   02/02/13 0230  BP: 131/73  Pulse: 63  Temp:   Resp: 20    BP 131/73  Pulse 63  Temp(Src) 98.7 F (37.1 C)  Resp 20  SpO2 94%  General Appearance:    Alert, oriented, no distress, appears stated age  Head:    Normocephalic, atraumatic  Eyes:    PERRL, EOMI, sclera non-icteric        Nose:   Nares without drainage or epistaxis. Mucosa, turbinates normal  Throat:   Moist mucous membranes. Oropharynx without erythema or exudate.  Neck:   Supple. No carotid bruits.  No thyromegaly.  No lymphadenopathy.   Back:     No CVA tenderness, no spinal tenderness  Lungs:     Clear to auscultation bilaterally, without wheezes, rhonchi or rales  Chest wall:    No tenderness to palpitation  Heart:    Regular rate and rhythm without murmurs, gallops, rubs  Abdomen:     Soft, non-tender, nondistended, normal bowel sounds, no organomegaly  Genitalia:    deferred  Rectal:    deferred  Extremities:   No clubbing, cyanosis or edema.  Pulses:   2+ and symmetric all extremities  Skin:   Skin color, texture, turgor normal, no rashes or lesions  Lymph nodes:   Cervical, supraclavicular, and axillary nodes normal  Neurologic:   CNII-XII intact. Normal strength, sensation and reflexes      throughout    Labs on Admission:  Basic Metabolic Panel:  Recent Labs Lab 02/01/13 2118  NA 145  K 3.6*  CL 106  CO2 25  GLUCOSE 95  BUN 11  CREATININE 0.71  CALCIUM 9.2   Liver Function Tests:  Recent Labs Lab 02/01/13 2118  AST 22  ALT 26  ALKPHOS 111  BILITOT 0.3  PROT 8.2  ALBUMIN 3.5   No results found for this basename: LIPASE, AMYLASE,  in the last 168 hours No results found for this basename: AMMONIA,  in the last 168 hours CBC:  Recent Labs Lab 02/01/13 2118  WBC 6.9  NEUTROABS 3.1  HGB 12.1  HCT 36.6  MCV 88.8  PLT 312   Cardiac Enzymes: No results found for this basename: CKTOTAL, CKMB, CKMBINDEX, TROPONINI,   in the last 168 hours  BNP (last 3 results) No results found for this basename: PROBNP,  in the last 8760 hours CBG:  Recent Labs Lab 02/01/13 2135  GLUCAP 84    Radiological Exams on Admission: Ct Head (brain) Wo Contrast  02/01/2013   CLINICAL DATA:  Headache and blurred vision.  EXAM: CT HEAD WITHOUT CONTRAST  TECHNIQUE: Contiguous axial images were obtained from the base of the skull through the vertex without intravenous contrast.  COMPARISON:  None.  FINDINGS: Sinuses/Soft tissues: Clear paranasal sinuses and mastoid air cells.  Intracranial: No mass lesion, hemorrhage, hydrocephalus, acute infarct, intra-axial, or extra-axial fluid collection.  IMPRESSION: Normal head CT.  These results were called by telephone at the time of interpretation on 02/01/2013 at 9:30 PM to neurology MD, who verbally acknowledged these results.   Electronically Signed   By: Jeronimo Greaves M.D.   On: 02/01/2013 21:31    EKG: Independently reviewed.  Assessment/Plan Active Problems:   Headache   1. Headache - Neurology is putting orders in regarding the patients treatment, hospitalist has been asked to admit for observation.    Code Status: Full  Family Communication: No family in room Disposition Plan: Admit to obs   Time spent: 30 min  Douglas Smolinsky M. [facility] Pager [phone]  If 7AM-7PM, please contact the day team taking care of the patient [email or web address] Password [facility] 02/02/2013, 3:14 AM

## 2013-02-02 NOTE — ED Provider Notes (Signed)
CSN: [id number]     Arrival date & time 02/01/13  2111 History   First MD Initiated Contact with Patient 02/01/13 2115     Chief Complaint  Patient presents with  . Code Stroke    HPI: Ms. Ricki Miller is a 43 yo F with history of HTN and asthma who presented as code stroke for acute onset headache. She was driving just prior to arrival and had acute onset of frontal headache associated with blurred vision. She describes her vision alteration as "like a fog in front of my eye." This had resolved on arrival to the ED. She had no neurologic deficits. On arrival she continued to endorse frontal headache, described as throbbing, non-radiating, worse with head movement, relieved partially by laying still. Pain is 10/10. Not associated with photophobia, nausea or vomiting. She has nearly monthly headaches, but not similar to current headache. EMS also noted blood pressure of 220 systolic. She does not currently take blood pressure medications. Of note patient is 3 weeks post partum.    Past Medical History  Diagnosis Date  . Hypertension   . Asthma   . Gestational diabetes     glyburide  . Hepatitis B    No past surgical history on file. Family History  Problem Relation Age of Onset  . Diabetes Father   . Hypertension Father   . Stroke Father   . Asthma Brother   . Asthma Son   . Hearing loss Maternal Aunt   . Alcohol abuse Neg Hx   . Arthritis Neg Hx   . Birth defects Neg Hx   . Cancer Neg Hx   . COPD Neg Hx   . Depression Neg Hx   . Drug abuse Neg Hx   . Early death Neg Hx   . Heart disease Neg Hx   . Hyperlipidemia Neg Hx   . Kidney disease Neg Hx   . Learning disabilities Neg Hx   . Mental illness Neg Hx   . Mental retardation Neg Hx   . Miscarriages / Stillbirths Neg Hx   . Vision loss Neg Hx    History  Substance Use Topics  . Smoking status: Never Smoker   . Smokeless tobacco: Never Used  . Alcohol Use: No   OB History   Grav Para Term Preterm Abortions TAB SAB Ect Mult  Living   2 2 1 1      2       Review of Systems  Constitutional: Negative for fever, chills, appetite change and fatigue.  Eyes: Positive for visual disturbance. Negative for photophobia.  Respiratory: Negative for cough and shortness of breath.   Cardiovascular: Negative for chest pain and leg swelling.  Gastrointestinal: Negative for nausea, vomiting, abdominal pain, diarrhea and constipation.  Genitourinary: Negative for dysuria, frequency and decreased urine volume.  Musculoskeletal: Negative for arthralgias, back pain, gait problem and myalgias.  Skin: Negative for color change and wound.  Neurological: Positive for headaches. Negative for dizziness, syncope and light-headedness.  Psychiatric/Behavioral: Negative for confusion and agitation.  All other systems reviewed and are negative.    Allergies  Review of patient's allergies indicates no known allergies.  Home Medications   Current Outpatient Rx  Name  Route  Sig  Dispense  Refill  . ibuprofen (ADVIL,MOTRIN) 200 MG tablet   Oral   Take 400 mg by mouth every 6 (six) hours as needed for moderate pain.         Marland Kitchen oxyCODONE-acetaminophen (PERCOCET/ROXICET) 5-325 MG per tablet  Oral   Take 1 tablet by mouth every 4 (four) hours as needed for severe pain.          BP 173/94  Pulse 60  Temp(Src) 99.1 F (37.3 C)  Resp 19  SpO2 97% Physical Exam  Nursing note and vitals reviewed. Constitutional: She is oriented to person, place, and time. No distress.  Middle age female, laying in bed, holding her head, appears uncomfortable.   HENT:  Head: Normocephalic and atraumatic.  Mouth/Throat: Oropharynx is clear and moist.  Eyes: Conjunctivae and EOM are normal. Pupils are equal, round, and reactive to light.  Neck: Normal range of motion. Neck supple.  Cardiovascular: Normal rate, regular rhythm, normal heart sounds and intact distal pulses.   Pulmonary/Chest: Effort normal and breath sounds normal. No respiratory  distress.  Abdominal: Soft. Bowel sounds are normal. There is no tenderness. There is no rebound and no guarding.  Musculoskeletal: Normal range of motion. She exhibits no edema and no tenderness.  Neurological: She is alert and oriented to person, place, and time. She has normal strength and normal reflexes. She displays no atrophy and no tremor. No cranial nerve deficit or sensory deficit. She exhibits normal muscle tone. Coordination normal. GCS eye subscore is 4. GCS verbal subscore is 5. GCS motor subscore is 6.  CN 3-12 without gross deficit. Strength 5/5 X 4, DTR's 2+. No pronator drift. Finger to nose and heel to shin normal. Gait not tested due to headache.   Skin: Skin is warm and dry. No rash noted.  Psychiatric: She has a normal mood and affect. Her behavior is normal.    ED Course  LUMBAR PUNCTURE Date/Time: 02/01/2013 11:30 PM Performed by: Margie BilletALLEN, Yang Rack Authorized by: Ranae PalmsYELVERTON, DAVID Consent: written consent obtained. Risks and benefits: risks, benefits and alternatives were discussed Consent given by: patient Patient identity confirmed: verbally with patient and arm band Time out: Immediately prior to procedure a "time out" was called to verify the correct patient, procedure, equipment, support staff and site/side marked as required. Indications: evaluation for infection and evaluation for subarachnoid hemorrhage Local anesthetic: lidocaine 1% with epinephrine Anesthetic total: 10 ml Patient sedated: no Preparation: Patient was prepped and draped in the usual sterile fashion. Lumbar space: L4-L5 interspace Patient's position: right lateral decubitus Needle gauge: 20 Needle type: spinal needle - Quincke tip Needle length: 3.5 in Number of attempts: 1 Opening pressure: 17 cm H2O Fluid appearance: clear Tubes of fluid: 4 Total volume: 4 ml Post-procedure: site cleaned and adhesive bandage applied Patient tolerance: Patient tolerated the procedure well with no  immediate complications.   (including critical care time) Labs Review Labs Reviewed  COMPREHENSIVE METABOLIC PANEL - Abnormal; Notable for the following:    Potassium 3.6 (*)    All other components within normal limits  CSF CULTURE  GRAM STAIN  CBC WITH DIFFERENTIAL  APTT  PROTIME-INR  GLUCOSE, CAPILLARY  CSF CELL COUNT WITH DIFFERENTIAL  CSF CELL COUNT WITH DIFFERENTIAL  GLUCOSE, CSF  PROTEIN, CSF  POCT PREGNANCY, URINE   Imaging Review Ct Head (brain) Wo Contrast  02/01/2013   CLINICAL DATA:  Headache and blurred vision.  EXAM: CT HEAD WITHOUT CONTRAST  TECHNIQUE: Contiguous axial images were obtained from the base of the skull through the vertex without intravenous contrast.  COMPARISON:  None.  FINDINGS: Sinuses/Soft tissues: Clear paranasal sinuses and mastoid air cells.  Intracranial: No mass lesion, hemorrhage, hydrocephalus, acute infarct, intra-axial, or extra-axial fluid collection.  IMPRESSION: Normal head CT.  These results were  called by telephone at the time of interpretation on 02/01/2013 at 9:30 PM to neurology MD, who verbally acknowledged these results.   Electronically Signed   By: Jeronimo Greaves M.D.   On: 02/01/2013 21:31    EKG Interpretation   None       MDM  43 yo F with history of HTN who presented initially as code stroke for sudden onset headache and blurred vision. No neurologic deficits on arrival, airway intact. Taken back to CT which was negative for stroke or ICH. Code stroke canceled. Blood pressure treated with 10 mg of labetalol with improvement from 170's systolic to 150's. She continued to complain of severe headache, neurology thinks likely migraine. Will treat with migraine cocktail. Neurology also requested LP which was negative for infection, opening pressure 17, doubt pseudotumor.  On reevaluation she continues to have headache, will given Mg.   Despite several treatments, she continues to have severe headache. Admitted to Medicine for  intractable headache. Patient in agreement with plan.   Reviewed imaging, labs and previous medical records, utilized in MDM  Discussed case with Dr. Ranae Palms   Clinical Impression 1. Headache  Margie Billet, MD 02/02/13 1419

## 2013-02-02 NOTE — Progress Notes (Signed)
UR completed 

## 2013-02-02 NOTE — Care Management Note (Unsigned)
    Page 1 of 1   02/02/2013     1:46:44 PM   CARE MANAGEMENT NOTE 02/02/2013  Patient:  Mary Richardson,Mary Richardson   Account Number:  [id number]  Date Initiated:  02/02/2013  Documentation initiated by:  Miyanna Wiersma  Subjective/Objective Assessment:   PT ADM ON 02/01/13 WITH HEADACHE.  PTA, PT INDEPENDENT OF ADLS.     Action/Plan:   WILL FOLLOW FOR DC NEEDS AS PT PROGRESSES.   Anticipated DC Date:  02/02/2013   Anticipated DC Plan:  HOME/SELF CARE      DC Planning Services  CM consult      Choice offered to / List presented to:             Status of service:  In process, will continue to follow Medicare Important Message given?   (If response is "NO", the following Medicare IM given date fields will be blank) Date Medicare IM given:   Date Additional Medicare IM given:    Discharge Disposition:    Per UR Regulation:  Reviewed for med. necessity/level of care/duration of stay  If discussed at Long Length of Stay Meetings, dates discussed:    Comments:

## 2013-02-02 NOTE — ED Notes (Signed)
Pharmacy called to inquire about compatibility of depacon and solumedrol, no research to back up, will start a 2nd IV.

## 2013-02-02 NOTE — ED Notes (Signed)
MD at BS

## 2013-02-02 NOTE — ED Notes (Signed)
Pt reports hx of preeclampsia with both of her pregnancies, pt gave birth to her second child three weeks ago.

## 2013-02-02 NOTE — Progress Notes (Signed)
Subjective: Headache is much improved, almost completely resolved.   Exam: Filed Vitals:   02/02/13 0438  BP: 167/106  Pulse: 65  Temp: 99.6 F (37.6 C)  Resp: 18   Gen: In bed, NAD MS: Awake, alert, interactive and appropriate NF:AOZHYCN:PERRL, VFF Motor: 5/5 throughout Sensory:intact to LT  Impression: 43 yo F with sudden onset severe headache. She does have a history of photophobic headacehs, though this one was much more severe. Possibilities include migraine that is different from usual due to pregnancy vs reversible cerebral vasospasm. With improvement in her headache, can be d/c home if CTA is negative.  Recommendations: 1) CT angio head 2) can d/c home if negative  Ritta SlotMcNeill Toni Hoffmeister, MD [facility] [phone]  If 7pm- 7am, please page neurology on call at [phone].

## 2013-02-02 NOTE — Progress Notes (Addendum)
PATIENT DETAILS Name: Mary Richardson Age: 43 y.o. Sex: female Date of Birth: 1970/08/22 Admit Date: 02/01/2013 Admitting Physician Hillary Bow, DO ZOX:WRUEAVWU,JWJXBJY A, MD  Subjective: Headache significantly better.  Assessment/Plan: Principal Problem:   Headache - Patient was brought in with sudden onset of severe headaches. CT head was negative. A lumbar puncture was done, CSF was not consistent with subarachnoid hemorrhage. Patient was given a dose of Decadron and Depakene with significant improvement. 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 Neurology also thinking that she may have had a reversible cerebral vasospasm, CTA of the head has been ordered. If negative she will be discharged home later today. Neurology-Dr. Amada Jupiter, recommending that we start her on verapamil as well,she did have a significantly uncontrolled blood pressure on admission.  Hypertensive urgency - BP significantly elevated on admission, not sure if this was contributing to patient's presenting symptoms. In any event, the pressure significantly better, however given history of migraine and hypertension, I will start her on verapamil as recommended by neurology. - Patient is breast-feeding intermittently, she mostly sees her infant formula. Since she would be on verapamil, and since verapamil is excreted in the breast milk, and because of the fact that she only feed her infant intermittently, I have advised her not to breast-feed anymore. She is agreeable with this.  Disposition: Remain inpatient-home after CT angiogram head  DVT Prophylaxis: SCD's  Code Status: Full code   Family Communication None  Procedures:  None  CONSULTS:  neurology  MEDICATIONS: Scheduled Meds: . metoCLOPramide (REGLAN) injection  10 mg Intravenous Q8H  . valproate  sodium  500 mg Intravenous Q12H  . verapamil  40 mg Oral Q12H   Continuous Infusions:  PRN Meds:.acetaminophen, ketorolac, labetalol  Antibiotics: Anti-infectives   None       PHYSICAL EXAM: Vital signs in last 24 hours: Filed Vitals:   02/02/13 0230 02/02/13 0323 02/02/13 0415 02/02/13 0438  BP: 131/73 158/99 165/106 167/106  Pulse: 63 57 52 65  Temp:    99.6 F (37.6 C)  TempSrc:    Oral  Resp: 20 14 18 18   Height:    5\' 5"  (1.651 m)  Weight:    96.934 kg (213 lb 11.2 oz)  SpO2: 94% 97% 97% 98%    Weight change:  Filed Weights   02/02/13 0438  Weight: 96.934 kg (213 lb 11.2 oz)   Body mass index is 35.56 kg/(m^2).   Gen Exam: Awake and alert with clear speech.   Neck: Supple, No JVD.   Chest: B/L Clear.   CVS: S1 S2 Regular, no murmurs.  Abdomen: soft, BS +, non tender, non distended.  Extremities: no edema, lower extremities warm to touch. Neurologic: Non Focal.   Skin: No Rash.   Wounds: N/A.    Intake/Output from previous day:  Intake/Output Summary (Last 24 hours) at 02/02/13 1347 Last data filed at 02/02/13 0800  Gross per 24 hour  Intake    290 ml  Output      0 ml  Net    290 ml     LAB RESULTS: CBC  Recent Labs Lab 02/01/13 2118  WBC 6.9  HGB 12.1  HCT 36.6  PLT 312  MCV 88.8  MCH 29.4  MCHC 33.1  RDW 13.0  LYMPHSABS 2.8  MONOABS 0.6  EOSABS 0.3  BASOSABS 0.1  Chemistries   Recent Labs Lab 02/01/13 2118  NA 145  K 3.6*  CL 106  CO2 25  GLUCOSE 95  BUN 11  CREATININE 0.71  CALCIUM 9.2    CBG:  Recent Labs Lab 02/01/13 2135  GLUCAP 84    GFR Estimated Creatinine Clearance: 107.7 ml/min (by C-G formula based on Cr of 0.71).  Coagulation profile  Recent Labs Lab 02/01/13 2118  INR 1.03    Cardiac Enzymes No results found for this basename: CK, CKMB, TROPONINI, MYOGLOBIN,  in the last 168 hours  No components found with this basename: POCBNP,  No results found for this basename: DDIMER,  in the  last 72 hours No results found for this basename: HGBA1C,  in the last 72 hours No results found for this basename: CHOL, HDL, LDLCALC, TRIG, CHOLHDL, LDLDIRECT,  in the last 72 hours No results found for this basename: TSH, T4TOTAL, FREET3, T3FREE, THYROIDAB,  in the last 72 hours No results found for this basename: VITAMINB12, FOLATE, FERRITIN, TIBC, IRON, RETICCTPCT,  in the last 72 hours No results found for this basename: LIPASE, AMYLASE,  in the last 72 hours  Urine Studies No results found for this basename: UACOL, UAPR, USPG, UPH, UTP, UGL, UKET, UBIL, UHGB, UNIT, UROB, ULEU, UEPI, UWBC, URBC, UBAC, CAST, CRYS, UCOM, BILUA,  in the last 72 hours  MICROBIOLOGY: Recent Results (from the past 240 hour(s))  CSF CULTURE     Status: None   Collection Time    02/01/13 11:35 PM      Result Value Range Status   Specimen Description CSF   Final   Special Requests NO 2 2CC   Final   Gram Stain     Final   Value: RARE WBC PRESENT, PREDOMINANTLY MONONUCLEAR     NO ORGANISMS SEEN     Performed at [facility]     Performed at [facility]   Culture PENDING   Incomplete   Report Status PENDING   Incomplete  GRAM STAIN     Status: None   Collection Time    02/01/13 11:35 PM      Result Value Range Status   Specimen Description CSF   Final   Special Requests NO 2 2CC   Final   Gram Stain     Final   Value: CYTOSPIN SLIDE     WBC PRESENT, PREDOMINANTLY MONONUCLEAR     NO ORGANISMS SEEN   Report Status 02/02/2013 FINAL   Final    RADIOLOGY STUDIES/RESULTS: Ct Head (brain) Wo Contrast  02/01/2013   CLINICAL DATA:  Headache and blurred vision.  EXAM: CT HEAD WITHOUT CONTRAST  TECHNIQUE: Contiguous axial images were obtained from the base of the skull through the vertex without intravenous contrast.  COMPARISON:  None.  FINDINGS: Sinuses/Soft tissues: Clear paranasal sinuses and mastoid air cells.  Intracranial: No mass lesion, hemorrhage, hydrocephalus, acute infarct,  intra-axial, or extra-axial fluid collection.  IMPRESSION: Normal head CT.  These results were called by telephone at the time of interpretation on 02/01/2013 at 9:30 PM to neurology MD, who verbally acknowledged these results.   Electronically Signed   By: Jeronimo Greaves M.D.   On: 02/01/2013 21:31   US Fetal Bpp W/o Non Stress  01/05/2013   OBSTETRICAL ULTRASOUND: This exam was performed within a [facility]. The OB US report was generated in the AS system, and faxed to the ordering physician.   This report is also available in [employer] and in the [employer] PACS. See AS Obstetric US report.   Jeoffrey MassedGHIMIRE,Garnett Rekowski, MD  [facility] Pager:[phone]  If 7PM-7AM, please contact night-coverage [email or web address] Password [email or web address] 02/02/2013, 1:47 PM   LOS: 1 day

## 2013-02-02 NOTE — Discharge Summary (Signed)
PATIENT DETAILS Name: Mary Richardson Age: 43 y.o. Sex: female Date of Birth: 1970/08/26 MRN: [id number]. Admit Date: 02/01/2013 Admitting Physician: Hillary Bow, DO WUJ:WJXBJYNW,GNFAOZH A, MD  Recommendations for Outpatient Follow-up:  1. Optimize blood pressure control 2. Gen. health maintenance  PRIMARY DISCHARGE DIAGNOSIS:  Principal Problem:   Headache      PAST MEDICAL HISTORY: Past Medical History  Diagnosis Date  . Hypertension   . Asthma   . Gestational diabetes     glyburide  . Hepatitis B   . Headache(784.0)     DISCHARGE MEDICATIONS:   Medication List    STOP taking these medications       oxyCODONE-acetaminophen 5-325 MG per tablet  Commonly known as:  PERCOCET/ROXICET      TAKE these medications       ibuprofen 200 MG tablet  Commonly known as:  ADVIL,MOTRIN  Take 4 tablets (800 mg total) by mouth every 8 (eight) hours as needed for headache or moderate pain.     verapamil 40 MG tablet  Commonly known as:  CALAN  Take 1 tablet (40 mg total) by mouth every 12 (twelve) hours.        ALLERGIES:  No Known Allergies  BRIEF HPI:  See H&P, Labs, Consult and Test reports for all details in brief, patient is a 43 year old female who presented with sudden onset of severe headaches. She was found to have significantly elevated blood pressures on admission. CT head was negative, lumbar puncture was done, CSF analysis was not consistent with subarachnoid hemorrhage. Hospitalist service was consulted for admission.  CONSULTATIONS:   neurology  PERTINENT RADIOLOGIC STUDIES: Ct Head (brain) Wo Contrast  02/01/2013   CLINICAL DATA:  Headache and blurred vision.  EXAM: CT HEAD WITHOUT CONTRAST  TECHNIQUE: Contiguous axial images were obtained from the base of the skull through the vertex without intravenous contrast.  COMPARISON:  None.  FINDINGS: Sinuses/Soft tissues: Clear paranasal sinuses and mastoid air cells.  Intracranial: No mass lesion,  hemorrhage, hydrocephalus, acute infarct, intra-axial, or extra-axial fluid collection.  IMPRESSION: Normal head CT.  These results were called by telephone at the time of interpretation on 02/01/2013 at 9:30 PM to neurology MD, who verbally acknowledged these results.   Electronically Signed   By: Jeronimo Greaves M.D.   On: 02/01/2013 21:31   US Fetal Bpp W/o Non Stress  01/05/2013   OBSTETRICAL ULTRASOUND: This exam was performed within a [facility]. The OB US report was generated in the AS system, and faxed to the ordering physician.   This report is also available in [employer] and in the [employer]. See AS Obstetric US report.    PERTINENT LAB RESULTS: CBC:  Recent Labs  02/01/13 2118  WBC 6.9  HGB 12.1  HCT 36.6  PLT 312   CMET CMP     Component Value Date/Time   NA 145 02/01/2013 2118   K 3.6* 02/01/2013 2118   CL 106 02/01/2013 2118   CO2 25 02/01/2013 2118   GLUCOSE 95 02/01/2013 2118   BUN 11 02/01/2013 2118   CREATININE 0.71 02/01/2013 2118   CALCIUM 9.2 02/01/2013 2118   PROT 8.2 02/01/2013 2118   ALBUMIN 3.5 02/01/2013 2118   AST 22 02/01/2013 2118   ALT 26 02/01/2013 2118   ALKPHOS 111 02/01/2013 2118   BILITOT 0.3 02/01/2013 2118   GFRNONAA >90 02/01/2013 2118   GFRAA >90 02/01/2013 2118    GFR Estimated Creatinine Clearance: 107.7 ml/min (by  C-G formula based on Cr of 0.71). No results found for this basename: LIPASE, AMYLASE,  in the last 72 hours No results found for this basename: CKTOTAL, CKMB, CKMBINDEX, TROPONINI,  in the last 72 hours No components found with this basename: POCBNP,  No results found for this basename: DDIMER,  in the last 72 hours No results found for this basename: HGBA1C,  in the last 72 hours No results found for this basename: CHOL, HDL, LDLCALC, TRIG, CHOLHDL, LDLDIRECT,  in the last 72 hours No results found for this basename: TSH, T4TOTAL, FREET3, T3FREE, THYROIDAB,  in the last 72 hours No  results found for this basename: VITAMINB12, FOLATE, FERRITIN, TIBC, IRON, RETICCTPCT,  in the last 72 hours Coags:  Recent Labs  02/01/13 2118  INR 1.03   Microbiology: Recent Results (from the past 240 hour(s))  CSF CULTURE     Status: None   Collection Time    02/01/13 11:35 PM      Result Value Range Status   Specimen Description CSF   Final   Special Requests NO 2 2CC   Final   Gram Stain     Final   Value: RARE WBC PRESENT, PREDOMINANTLY MONONUCLEAR     NO ORGANISMS SEEN     Performed at [facility]     Performed at [facility]   Culture PENDING   Incomplete   Report Status PENDING   Incomplete  GRAM STAIN     Status: None   Collection Time    02/01/13 11:35 PM      Result Value Range Status   Specimen Description CSF   Final   Special Requests NO 2 2CC   Final   Gram Stain     Final   Value: CYTOSPIN SLIDE     WBC PRESENT, PREDOMINANTLY MONONUCLEAR     NO ORGANISMS SEEN   Report Status 02/02/2013 FINAL   Final     BRIEF HOSPITAL COURSE:  Headache  - Patient was brought in with sudden onset of severe headaches. CT head was negative. A lumbar puncture was done, CSF was not consistent with subarachnoid hemorrhage. Patient was given a dose of Decadron and Depakene with significant improvement. 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 Neurology also thinking that she may have had a reversible cerebral vasospasm, CTA of the head was orderedm was negative. Neurology-Dr. Amada Jupiter, recommending that we start her on verapamil as well,she did have a significantly uncontrolled blood pressure on admission.   Hypertensive urgency  - BP significantly elevated on admission, not sure if this was contributing to patient's presenting symptoms. In any event, the pressure significantly better, however given history of migraine  and hypertension, I will start her on verapamil as recommended by neurology.  - Patient is breast-feeding intermittently, she mostly sees her infant formula. Since she would be on verapamil, and since verapamil is excreted in the breast milk, and because of the fact that she only feed her infant intermittently, I have advised her not to breast-feed anymore. She is agreeable with this.  TODAY-DAY OF DISCHARGE:  Subjective:   Katelen FatouNgom today has no headache,no chest abdominal pain,no new weakness tingling or numbness, feels much better wants to go home today.   Objective:   Blood pressure 153/85, pulse 65, temperature 97.9 F (36.6 C), temperature source Oral, resp.  rate 18, height 5\' 5"  (1.651 m), weight 96.934 kg (213 lb 11.2 oz), SpO2 97.00%.  Intake/Output Summary (Last 24 hours) at 02/02/13 1832 Last data filed at 02/02/13 1445  Gross per 24 hour  Intake    770 ml  Output      0 ml  Net    770 ml   Filed Weights   02/02/13 0438  Weight: 96.934 kg (213 lb 11.2 oz)    Exam Awake Alert, Oriented *3, No new F.N deficits, Normal affect [place].AT,PERRAL Supple Neck,No JVD, No cervical lymphadenopathy appriciated.  Symmetrical Chest wall movement, Good air movement bilaterally, CTAB RRR,No Gallops,Rubs or new Murmurs, No Parasternal Heave +ve B.Sounds, Abd Soft, Non tender, No organomegaly appriciated, No rebound -guarding or rigidity. No Cyanosis, Clubbing or edema, No new Rash or bruise  DISCHARGE CONDITION: Stable  DISPOSITION: Home  DISCHARGE INSTRUCTIONS:    Activity:  As tolerated   Diet recommendation: Heart Healthy diet  Discharge Orders   Future Appointments Provider Department Dept Phone   02/21/2013 12:00 PM [facility] Covering Provider [facility] [phone]   Future Orders Complete By Expires   Call MD for:  difficulty breathing, headache or visual disturbances  As directed    Call MD for:  persistant nausea and vomiting  As  directed    Call MD for:  severe uncontrolled pain  As directed    Diet - low sodium heart healthy  As directed    Increase activity slowly  As directed       Follow-up Information   Follow up with MARSHALL,BERNARD A, MD. (In two days if not better)    Specialty:  Obstetrics and Gynecology   Contact information:   [address] [place] [address] [phone]       [facility]. (For worsening headache, trouble seeing, weakness or other symptoms that concern you. )    Specialty:  Emergency Medicine   Contact information:   [address] [id number] [place] [address] [phone]      [facility] On 02/21/2013. (12:00; please bring photo ID and all medications you are currently taking.  )    Contact information:   [address] [place] [phone]        Total Time spent on discharge equals 45 minutes.  SignedJeoffrey Massed: GHIMIRE,SHANKER 02/02/2013 6:32 PM

## 2013-02-02 NOTE — Discharge Instructions (Signed)
Your blood pressure was elevated. Please see your doctor in a few days for recheck. You may need to be started on blood pressure medication.   Migraine Headache A migraine headache is an intense, throbbing pain on one or both sides of your head. A migraine can last for 30 minutes to several hours. CAUSES  The exact cause of a migraine headache is not always known. However, a migraine may be caused when nerves in the brain become irritated and release chemicals that cause inflammation. This causes pain. Certain things may also trigger migraines, such as:  Alcohol.  Smoking.  Stress.  Menstruation.  Aged cheeses.  Foods or drinks that contain nitrates, glutamate, aspartame, or tyramine.  Lack of sleep.  Chocolate.  Caffeine.  Hunger.  Physical exertion.  Fatigue.  Medicines used to treat chest pain (nitroglycerine), birth control pills, estrogen, and some blood pressure medicines. SIGNS AND SYMPTOMS  Pain on one or both sides of your head.  Pulsating or throbbing pain.  Severe pain that prevents daily activities.  Pain that is aggravated by any physical activity.  Nausea, vomiting, or both.  Dizziness.  Pain with exposure to bright lights, loud noises, or activity.  General sensitivity to bright lights, loud noises, or smells. Before you get a migraine, you may get warning signs that a migraine is coming (aura). An aura may include:  Seeing flashing lights.  Seeing bright spots, halos, or zig-zag lines.  Having tunnel vision or blurred vision.  Having feelings of numbness or tingling.  Having trouble talking.  Having muscle weakness. DIAGNOSIS  A migraine headache is often diagnosed based on:  Symptoms.  Physical exam.  A CT scan or MRI of your head. These imaging tests cannot diagnose migraines, but they can help rule out other causes of headaches. TREATMENT Medicines may be given for pain and nausea. Medicines can also be given to help prevent  recurrent migraines.  HOME CARE INSTRUCTIONS  Only take over-the-counter or prescription medicines for pain or discomfort as directed by your health care provider. The use of long-term narcotics is not recommended.  Lie down in a dark, quiet room when you have a migraine.  Keep a journal to find out what may trigger your migraine headaches. For example, write down:  What you eat and drink.  How much sleep you get.  Any change to your diet or medicines.  Limit alcohol consumption.  Quit smoking if you smoke.  Get 7 9 hours of sleep, or as recommended by your health care provider.  Limit stress.  Keep lights dim if bright lights bother you and make your migraines worse. SEEK IMMEDIATE MEDICAL CARE IF:   Your migraine becomes severe.  You have a fever.  You have a stiff neck.  You have vision loss.  You have muscular weakness or loss of muscle control.  You start losing your balance or have trouble walking.  You feel faint or pass out.  You have severe symptoms that are different from your first symptoms. MAKE SURE YOU:   Understand these instructions.  Will watch your condition.  Will get help right away if you are not doing well or get worse. Document Released: 12/30/2004 Document Revised: 10/20/2012 Document Reviewed: 09/06/2012 [facility] Patient Information 2014 [employer].

## 2013-02-04 NOTE — ED Provider Notes (Signed)
I saw and evaluated the patient, reviewed the resident's note and I agree with the findings and plan.  I was present and supervised key portions of lumbar puncture EKG Interpretation    Date/Time:  Tuesday February 01 2013 21:28:50 EST Ventricular Rate:  59 PR Interval:  169 QRS Duration: 83 QT Interval:  418 QTC Calculation: 414 R Axis:   46 Text Interpretation:  Sinus rhythm Probable left atrial enlargement ED PHYSICIAN INTERPRETATION AVAILABLE IN [facility] Confirmed by TEST, RECORD ([id number]) on 02/03/2013 10:20:27 AM Also confirmed by Ranae PalmsYELVERTON  MD, Jeronimo Hellberg ([id number])  on 02/04/2013 6:31:24 AM            Patient presents with acute onset headache and blurred vision. She came in as a code stroke. She has no focal weakness or numbness. CT head was normal. She was evaluated by neurology who suggested lumbar puncture to rule out subarachnoid hemorrhage. There is a suspicion given normal CT within six-hours. Patient turned over to oncoming emergency physician pending CSF studies  Loren Raceravid Seanne Chirico, MD 02/04/13 [phone]

## 2013-02-05 LAB — CSF CULTURE

## 2013-02-05 LAB — CSF CULTURE W GRAM STAIN: Culture: NO GROWTH

## 2013-02-21 ENCOUNTER — Inpatient Hospital Stay: Payer: Self-pay

## 2013-10-28 ENCOUNTER — Other Ambulatory Visit: Payer: Self-pay

## 2013-11-14 ENCOUNTER — Encounter (HOSPITAL_COMMUNITY): Payer: Self-pay | Admitting: General Practice

## 2014-04-07 ENCOUNTER — Emergency Department (HOSPITAL_BASED_OUTPATIENT_CLINIC_OR_DEPARTMENT_OTHER)
Admission: EM | Admit: 2014-04-07 | Discharge: 2014-04-07 | Disposition: A | Discharge status: Home / Self Care | Payer: Medicaid Other | Attending: Emergency Medicine | Admitting: Emergency Medicine

## 2014-04-07 ENCOUNTER — Encounter (HOSPITAL_BASED_OUTPATIENT_CLINIC_OR_DEPARTMENT_OTHER): Payer: Self-pay | Admitting: *Deleted

## 2014-04-07 DIAGNOSIS — R51 Headache: Secondary | ICD-10-CM | POA: Insufficient documentation

## 2014-04-07 DIAGNOSIS — I10 Essential (primary) hypertension: Secondary | ICD-10-CM | POA: Insufficient documentation

## 2014-04-07 DIAGNOSIS — R519 Headache, unspecified: Secondary | ICD-10-CM

## 2014-04-07 DIAGNOSIS — E669 Obesity, unspecified: Secondary | ICD-10-CM | POA: Insufficient documentation

## 2014-04-07 DIAGNOSIS — J45909 Unspecified asthma, uncomplicated: Secondary | ICD-10-CM | POA: Insufficient documentation

## 2014-04-07 DIAGNOSIS — Z8632 Personal history of gestational diabetes: Secondary | ICD-10-CM | POA: Insufficient documentation

## 2014-04-07 DIAGNOSIS — Z8619 Personal history of other infectious and parasitic diseases: Secondary | ICD-10-CM | POA: Insufficient documentation

## 2014-04-07 HISTORY — DX: Migraine, unspecified, not intractable, without status migrainosus: G43.909

## 2014-04-07 MED ORDER — KETOROLAC TROMETHAMINE 30 MG/ML IJ SOLN
30.0000 mg | Freq: Once | INTRAMUSCULAR | Status: AC
  Administered 2014-04-07: 30 mg via INTRAVENOUS
  Filled 2014-04-07: qty 1

## 2014-04-07 MED ORDER — METOCLOPRAMIDE HCL 5 MG/ML IJ SOLN
10.0000 mg | Freq: Once | INTRAMUSCULAR | Status: AC
  Administered 2014-04-07: 10 mg via INTRAVENOUS
  Filled 2014-04-07: qty 2

## 2014-04-07 MED ORDER — METHYLPREDNISOLONE SODIUM SUCC 125 MG IJ SOLR
125.0000 mg | Freq: Once | INTRAMUSCULAR | Status: AC
  Administered 2014-04-07: 125 mg via INTRAVENOUS
  Filled 2014-04-07: qty 2

## 2014-04-07 MED ORDER — DIPHENHYDRAMINE HCL 50 MG/ML IJ SOLN
25.0000 mg | Freq: Once | INTRAMUSCULAR | Status: AC
  Administered 2014-04-07: 25 mg via INTRAVENOUS
  Filled 2014-04-07: qty 1

## 2014-04-07 MED ORDER — BUTALBITAL-APAP-CAFFEINE 50-325-40 MG PO TABS: 1.0000 | ORAL_TABLET | Freq: Four times a day (QID) | ORAL | Status: DC | PRN

## 2014-04-07 MED ORDER — SODIUM CHLORIDE 0.9 % IV BOLUS (SEPSIS)
1000.0000 mL | Freq: Once | INTRAVENOUS | Status: AC
  Administered 2014-04-07: 1000 mL via INTRAVENOUS

## 2014-04-07 NOTE — ED Provider Notes (Signed)
CSN: [id number]     Arrival date & time 04/07/14  1533 History   First MD Initiated Contact with Patient 04/07/14 1608     Chief Complaint  Patient presents with  . Migraine     (Consider location/radiation/quality/duration/timing/severity/associated sxs/prior Treatment) HPI  Mary Richardson is a 44 y.o. female complaining of right periorbital headache described as throbbing, rated at 6 out of 10, not relieved with acetaminophen starting 3 days ago associated with nausea and single episode of nonbloody, nonbilious, coffee-ground emesis 2 days ago. This is consistent with prior headache exacerbations. States that she feels her headaches are exacerbated when she doesn't sleep and her daughter has been crying in keeping her up at night. She states that her blood pressure was elevated at 155/90, she was treated for high blood pressure but her medication ran out one year ago. Pt denies fever, rash, confusion, cervicalgia, LOC/syncope, change in vision, numbness, weakness, dysarthria, ataxia, thunderclap onset, exacerbation with exertion or valsalva, exacerbation in morning, CP, SOB, abdominal pain.  Past Medical History  Diagnosis Date  . Hypertension   . Asthma   . Gestational diabetes     glyburide  . Hepatitis B   . Headache(784.0)   . Migraine    Past Surgical History  Procedure Laterality Date  . No past surgeries     Family History  Problem Relation Age of Onset  . Diabetes Father   . Hypertension Father   . Stroke Father   . Asthma Brother   . Asthma Son   . Hearing loss Maternal Aunt   . Alcohol abuse Neg Hx   . Arthritis Neg Hx   . Birth defects Neg Hx   . Cancer Neg Hx   . COPD Neg Hx   . Depression Neg Hx   . Drug abuse Neg Hx   . Early death Neg Hx   . Heart disease Neg Hx   . Hyperlipidemia Neg Hx   . Kidney disease Neg Hx   . Learning disabilities Neg Hx   . Mental illness Neg Hx   . Mental retardation Neg Hx   . Miscarriages / Stillbirths Neg Hx   . Vision loss  Neg Hx    History  Substance Use Topics  . Smoking status: Never Smoker   . Smokeless tobacco: Never Used  . Alcohol Use: No   OB History    Gravida Para Term Preterm AB TAB SAB Ectopic Multiple Living   2 2 1 1      2      Review of Systems  10 systems reviewed and found to be negative, except as noted in the HPI.   Allergies  Review of patient's allergies indicates no known allergies.  Home Medications   Prior to Admission medications   Medication Sig Start Date End Date Taking? Authorizing Provider  ibuprofen (ADVIL,MOTRIN) 200 MG tablet Take 4 tablets (800 mg total) by mouth every 8 (eight) hours as needed for headache or moderate pain. 02/02/13   Shanker Levora DredgeM Ghimire, MD  verapamil (CALAN) 40 MG tablet Take 1 tablet (40 mg total) by mouth every 12 (twelve) hours. 02/02/13   Shanker Levora DredgeM Ghimire, MD   BP 134/80 mmHg  Pulse 89  Temp(Src) 98.3 F (36.8 C) (Oral)  Resp 18  Ht 5\' 6"  (1.676 m)  Wt 220 lb (99.791 kg)  BMI 35.53 kg/m2  SpO2 99%  LMP 04/03/2014 Physical Exam  Constitutional: She is oriented to person, place, and time.  Obese  HENT:  Head: Normocephalic and atraumatic.  Mouth/Throat: Oropharynx is clear and moist.  Eyes: Conjunctivae and EOM are normal. Pupils are equal, round, and reactive to light.  Neck: Normal range of motion. Neck supple.  FROM to C-spine. Pt can touch chin to chest without discomfort. No TTP of midline cervical spine.   Cardiovascular: Normal rate and regular rhythm.   Pulmonary/Chest: Effort normal and breath sounds normal. No respiratory distress. She has no wheezes. She has no rales. She exhibits no tenderness.  Abdominal: Soft. Bowel sounds are normal.  Neurological: She is alert and oriented to person, place, and time.  Follows commands, Clear, goal oriented speech, Strength is 5 out of 5x4 extremities, patient ambulates with a coordinated in nonantalgic gait. Sensation is grossly intact.     ED Course  Procedures (including  critical care time) Labs Review Labs Reviewed - No data to display  Imaging Review No results found.   EKG Interpretation None      MDM   Final diagnoses:  Acute nonintractable headache, unspecified headache type    Filed Vitals:   04/07/14 1544  BP: 134/80  Pulse: 89  Temp: 98.3 F (36.8 C)  TempSrc: Oral  Resp: 18  Height:  (1.676 m)  Weight: 220 lb (99.791 kg)  SpO2: 99%    Medications  sodium chloride 0.9 % bolus 1,000 mL (not administered)  metoCLOPramide (REGLAN) injection 10 mg (not administered)  diphenhydrAMINE (BENADRYL) injection 25 mg (not administered)  methylPREDNISolone sodium succinate (SOLU-MEDROL) 125 mg/2 mL injection 125 mg (not administered)  ketorolac (TORADOL) 30 MG/ML injection 30 mg (not administered)    Mary Richardson is a pleasant 44 y.o. female presenting with exacerbation of chronic headache, patient will be given headache cocktail (Toradol will be administered she is menstruating right now) neuro exam is nonfocal. HA. Non concerning for SAH, ICH, Meningitis, pseudotumor cerebri or temporal arteritis. Pt is afebrile with no focal neuro deficits, nuchal rigidity, or change in vision. Pt is to follow up with PCP to discuss prophylactic medication. Pt verbalizes understanding and is agreeable with plan to dc.  He reports that her headache is essentially resolved after fluids and headache cocktail. Outpatient primary care referral will be given.  Evaluation does not show pathology that would require ongoing emergent intervention or inpatient treatment. Pt is hemodynamically stable and mentating appropriately. Discussed findings and plan with patient/guardian, who agrees with care plan. All questions answered. Return precautions discussed and outpatient follow up given.   New Prescriptions   BUTALBITAL-ACETAMINOPHEN-CAFFEINE (FIORICET) 50-325-40 MG PER TABLET    Take 1 tablet by mouth every 6 (six) hours as needed for headache.          Wynetta Emery, PA-C 04/07/14 1900  Pricilla Loveless, MD 04/11/14 [phone]

## 2014-04-07 NOTE — Discharge Instructions (Signed)
Do not hesitate to return to the emergency room for any new, worsening or concerning symptoms. ° °Please obtain primary care using resource guide below. But the minute you were seen in the emergency room and that they will need to obtain records for further outpatient management. ° °.reso ° °Emergency Department Resource Guide °1) Find a Doctor and Pay Out of Pocket °Although you won't have to find out who is covered by your insurance plan, it is a good idea to ask around and get recommendations. You will then need to call the office and see if the doctor you have chosen will accept you as a new patient and what types of options they offer for patients who are self-pay. Some doctors offer discounts or will set up payment plans for their patients who do not have insurance, but you will need to ask so you aren't surprised when you get to your appointment. ° °2) Contact Your Local Health Department °Not all health departments have doctors that can see patients for sick visits, but many do, so it is worth a call to see if yours does. If you don't know where your local health department is, you can check in your phone book. The CDC also has a tool to help you locate your state's health department, and many state websites also have listings of all of their local health departments. ° °3) Find a Walk-in Clinic °If your illness is not likely to be very severe or complicated, you may want to try a walk in clinic. These are popping up all over the country in pharmacies, drugstores, and shopping centers. They're usually staffed by nurse practitioners or physician assistants that have been trained to treat common illnesses and complaints. They're usually fairly quick and inexpensive. However, if you have serious medical issues or chronic medical problems, these are probably not your best option. ° °No Primary Care Doctor: °- Call [facility] at  832-8000 - they can help you locate a primary care doctor that  accepts your  insurance, provides certain services, etc. °- Physician Referral Service- 1-800-533-3463 ° °Chronic Pain Problems: °Organization         Address  Phone   Notes  °[facility]  (336) 297-2271 Patients need to be referred by their primary care doctor.  ° °Medication Assistance: °Organization         Address  Phone   Notes  °[place] [facility] [address] °[place], [place] [address] (336) 641-8030 --Must be a resident of [place] °-- Must have NO insurance coverage whatsoever (no Medicaid/ Medicare, etc.) °-- The pt. MUST have a primary care doctor that directs their care regularly and follows them in the community °  °[facility]  (866) 331-1348   °[employer]  (888) 892-1162   ° °Agencies that provide inexpensive medical care: °Organization         Address  Phone   Notes  °[facility]  (336) 832-8035   °[facility]    (336) 832-7272   °[facility] [address] °[place], [place] [address] (336) 832-4777   °[facility] [address], °[place] (336) 271-4999   °[facility]    (336) 373-0678   °[facility]    (336) 272-1050   °[facility] ° [address], [place] Phone:  (336) 832-4444, Fax:  (336) 832-4440 Hours of Operation:  9 am -   6 pm, M-F.  Also accepts Medicaid/Medicare and self-pay.  °[facility] ° [address], [place] Phone: (336) 832-3150, Fax: (336) [phone]. Hours of Operation:  8:30 am - 5:30 pm, M-F.  Also accepts Medicaid and self-pay.  °[facility] [place] [address], [place] Phone: (336) 878-6027   °[facility] [address], [place], [place] (336)723-1848, Ext. 123 Mondays & Thursdays: 7-9 AM.  First 15 patients are seen on a first come, first serve basis. °  ° °Medicaid-accepting [place] Providers: ° °Organization          Address  Phone   Notes  °[facility] [address], [place] (336) 641-2100 Also accepts self-pay patients.  °[facility] [address], [place] ° (336) 856-9996   °[facility] [address], [place] (336) 288-8857   °[facility] [address] [place] [address], [place] (336) 299-7000   °Veita Bland [address], [place]  ° (336) 373-1557 Only accepts [place] Access Medicaid patients after they have their name applied to their card.  ° °Self-Pay (no insurance) in [place]: ° °Organization         Address  Phone   Notes  °Sickle Cell Patients, [facility] [address], [place] (336) 832-1970   °[facility] [address], [place] (336) 832-4400   °[facility] [place] ° [address], [place] (336) 992-4800   °[facility]/Dr. Osei-Bonsu ° [address] [place] [address], [place] or [address], [place] (336) [phone] Phone number for both [place] and [place] locations is the same.  °[facility] [address], [place] (336) 299-0000   °[facility] [address] [place] [address], [place] or [address] (336) 852-7530 °(336) 878-2260   °[facility] [address], [place] (336) 350-1642, phone; (336) 294-5005, fax Sees patients 1st and 3rd Saturday of every month.  Must not qualify for public or private insurance (i.e. Medicaid, Medicare, [place] Health Choice, Veterans' Benefits) • Household income should be no more than 200% of the poverty level •The clinic cannot treat you if you are pregnant or think you are pregnant • Sexually transmitted diseases are not treated at the clinic.  ° ° °Dental Care: °Organization         Address  Phone  Notes  °[facility] [facility] [address],  [place] (336) 641-6152 Accepts children up to age 21 who are enrolled in Medicaid or [place] Health Choice; pregnant women with a Medicaid card; and children who have applied for Medicaid or [place] Health Choice, but were declined, whose parents can pay a reduced fee at time of service.  °[facility] [place]  [address], [place] (336) 641-7733 Accepts children up to age 21 who are enrolled in Medicaid or [place] Health Choice; pregnant women with a Medicaid card; and children who have applied for Medicaid or [place] Health Choice, but were declined, whose parents can pay a reduced fee at time of service.  °[facility] ° [address], [place] (336) 641-4533 Patients are seen by appointment only. Walk-ins are not accepted. [facility] will see patients 18 years of age and   older. °Monday - Tuesday (8am-5pm) °Most Wednesdays (8:30-5pm) °$30 per visit, cash only  °[facility] ° [address], [place] (336) 641-4533 Patients are seen by appointment only. Walk-ins are not accepted. [facility] will see patients 18 years of age and older. °One Wednesday Evening (Monthly: Volunteer Based).  $30 per visit, cash only  °[facility]  (919) 537-3737 for adults; Children under age 4, call Graduate Pediatric Dentistry at ([phone]) [phone]. Children aged 4-14, please call (919) [phone] to request a pediatric application. ° Dental services are provided in all areas of dental care including fillings, crowns and bridges, complete and partial dentures, implants, gum treatment, root canals, and extractions. Preventive care is also provided. Treatment is provided to both adults and children. °Patients are selected via a lottery and there is often a waiting list. °  °[facility] [address], °[place] ° (336) 763-8833 [email or web address] °  °[facility] [address], [place], [place]  (336)723-1848, Ext. 123 Second and Fourth Thursday of each month, opens at 6:30 AM; Clinic ends at 9 AM.  Patients are seen on a first-come first-served basis, and a limited number are seen during each clinic.  ° °[facility] ° [address], [place], [place] (336) 723-7904   Eligibility Requirements °You must have lived in [place], [place], or [place] counties for at least the last three months. °  You cannot be eligible for state or federal sponsored healthcare insurance, including Veterans Administration, Medicaid, or Medicare. °  You generally cannot be eligible for healthcare insurance through your employer.  °  How to apply: °Eligibility screenings are held every Tuesday and Wednesday afternoon from 1:00 pm until 4:00 pm. You do not need an appointment for the interview!  °[facility] [address], [place], [place] 336-631-2330   °[facility]  336-342-8273   °[facility]  336-703-3100   °[facility]  336-570-6415   ° °Behavioral Health Resources in the Community: °Intensive Outpatient Programs °Organization         Address  Phone  Notes  °[facility] [address], [place], [place] 336-878-6098   °[facility] [address], [place], [place] 336-832-9800   °[facility] [address], [place], [place] ° 336-882-2125   °[facility] [address],  °[place], [place] 1-800-853-5163 or 336-641-4981   °Substance Abuse Resources °Organization         Address  Phone  Notes  °[facility]  336-882-2125   °[facility]  336-784-9470   °[facility]  336-285-9073   °[facility]  336-845-3988   °Residential & Outpatient Substance Abuse Program  1-800-659-3381   °Psychological Services °Organization         Address  Phone  Notes  °[facility]  336- 832-9600   °[facility]  336- 378-7881    °[facility] [address], [place] 1-800-853-5163 or 336-641-4981   ° °Mobile Crisis Teams °Organization         Address  Phone  Notes  °[facility], Mobile Crisis Care Unit  1-877-626-1772   °Assertive °[facility] ° [address] [place], [place] 336-834-9664   °Sharon DeEsch [address] °[place] 336-554-5454   ° °Self-Help/Support Groups °Organization         Address    Phone             Notes  °[facility] - variety of support groups  336- 373-1402 Call for more information  °Narcotics Anonymous (NA), [facility] [address], °[place]  2 meetings at this location  ° °Residential Treatment Programs °Organization         Address  Phone  Notes  °[facility] [address],    °[place]  1-866-801-8205   °[facility] ° [address], [place], [place] 704-293-8524   °[facility] [address], [place] 336-845-3988 Admissions: 8am-3pm M-F  °[facility] [address]    °[place], [place] 336-841-1104   °[facility] [address], [place], [place] 336-379-7146   °[facility] [address]  °[place], [place] 336-285-9073   °[facility] Intensive Outpatient [address], [place], [place] 336-852-3033   °[facility] [address]  °[place], [place] 1-877-615-2722 or 336-784-9470   °[facility] [address] [place], [place] 336-227-7417 Accepts Medicaid  °[facility] [address]  °[place] Person 1-800-659-3381 Substance Abuse/Addiction Treatment  ° °[place] Behavioral Health Resources °Organization         Address  Phone  Notes  °[facility]  (888) 581-9988   °Julie Brannon, PhD [address] [place], [place]   (336) 349-5553 or (336) 951-0000   °[facility]   [address] °[place], [place] (336) 349-4454   °[facility] [address], [place], [place] (336) 342-8316 Insurance/Medicaid/sponsorship through [facility]  °[facility] [address]                                    [place], [place] (336) 342-8316 Therapy/tele-psych/case  °[facility] [address]  ° [place], [place] (336) 349-2233    °Dr. Arfeen  (336) 349-4544   °[facility] [place]  [employer] [place] [facility] 1) [address], [place] °2) [address], [place] °3)  [address], [place] (336) 349-3220 °(336) 342-7768 ° °(336) 342-8140   °[place] [facility] (336) 342-1394 or (336) 342-3537 (After Hours)    ° ° ° °

## 2014-05-11 IMAGING — US US FETAL BPP W/O NONSTRESS
2 series · 13 of 21 positions shown · non-contrast
Comparison: none

[Series 1: us fetal bpp w/o nonstress · non-contrast · 2 of 4 slices shown (1 of 2)]
[im 1/4]
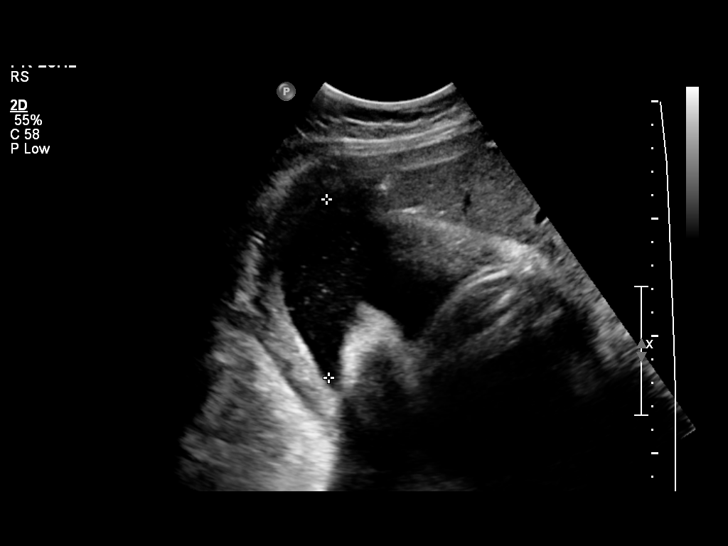
[im 3/4]
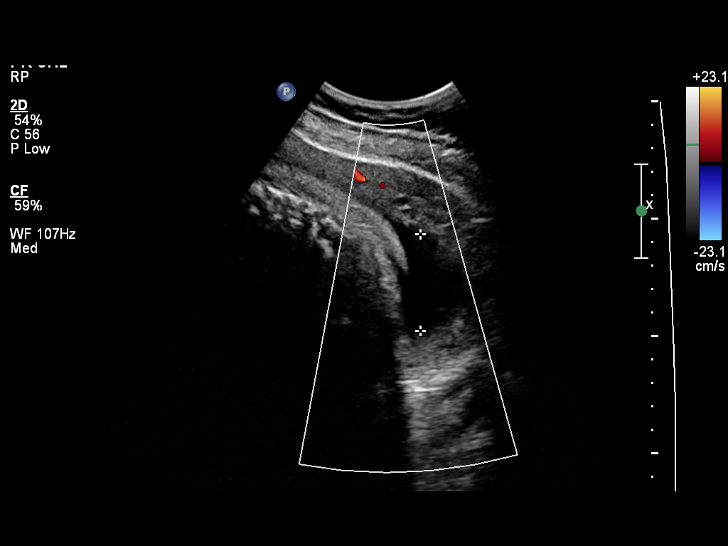

[Series 1: us fetal bpp w/o nonstress · non-contrast · 17 acquisitions, 11 frames shown (2 of 2)]
[im 1/17]
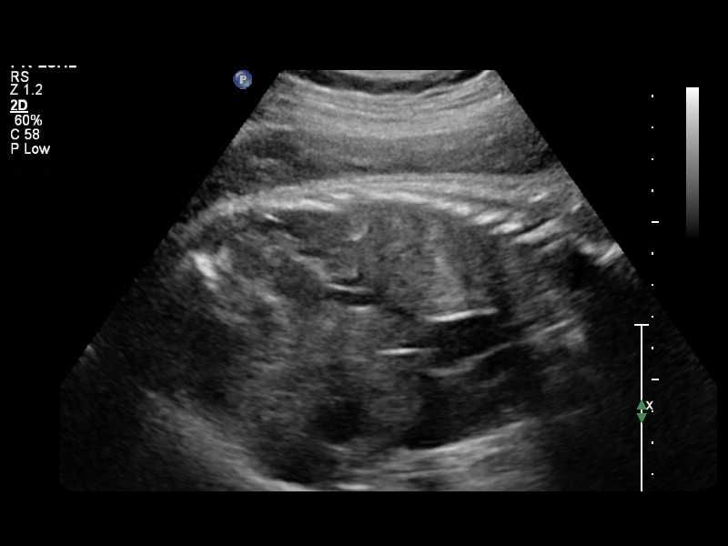
[im 2/17]
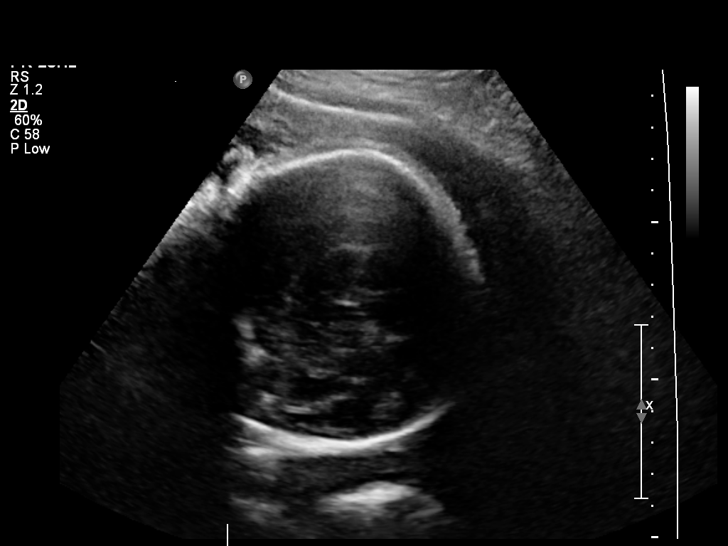
[im 4/17]
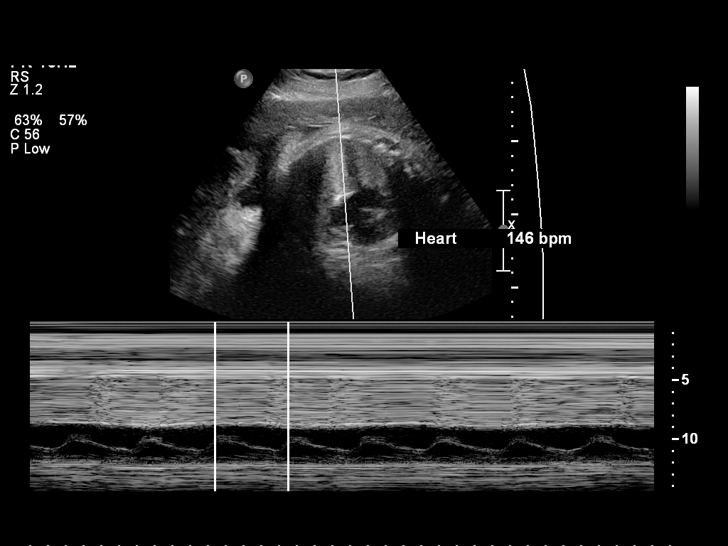
[im 5/17]
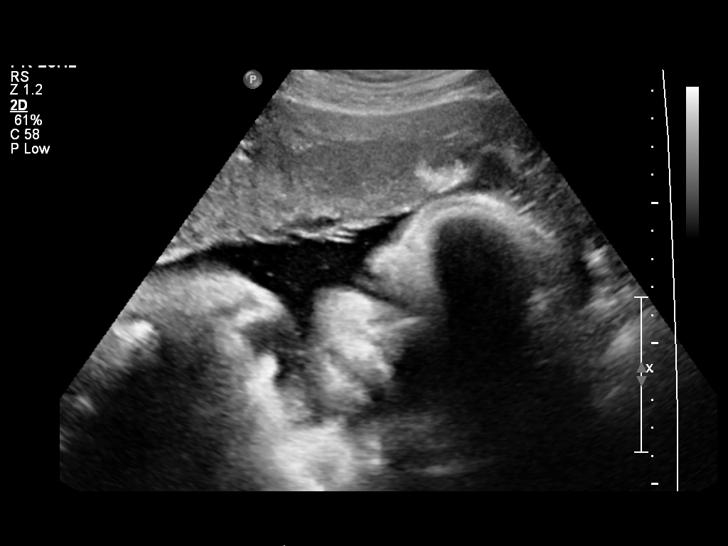
[im 7/17]
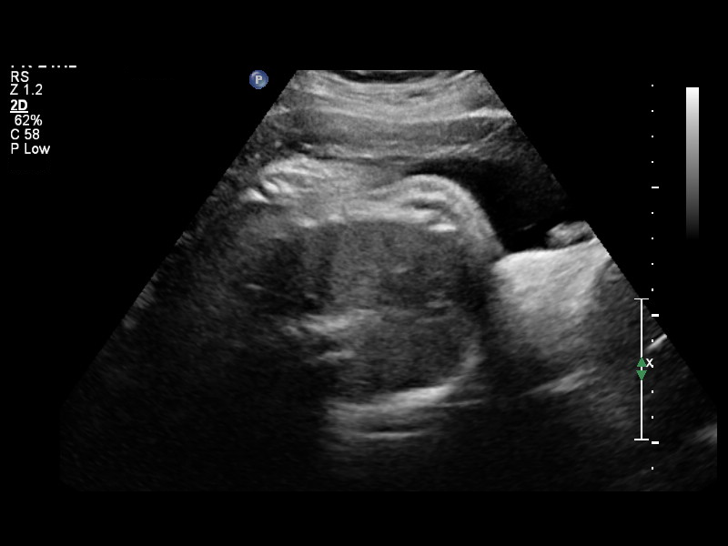
[im 9/17]
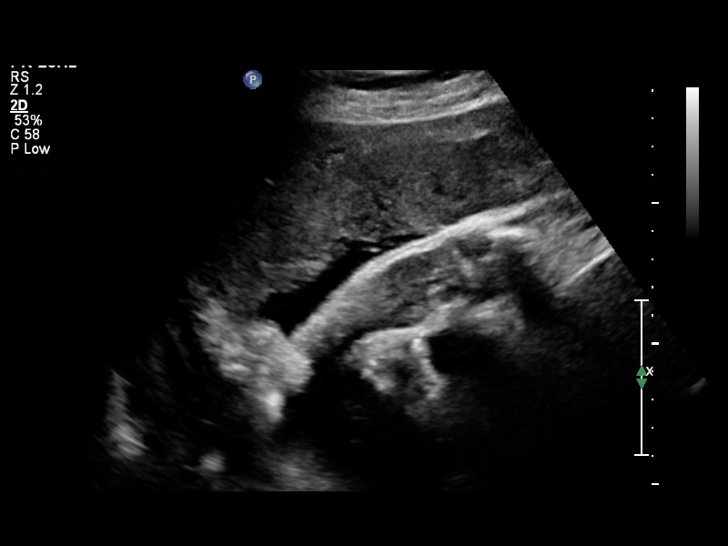
[im 10/17]
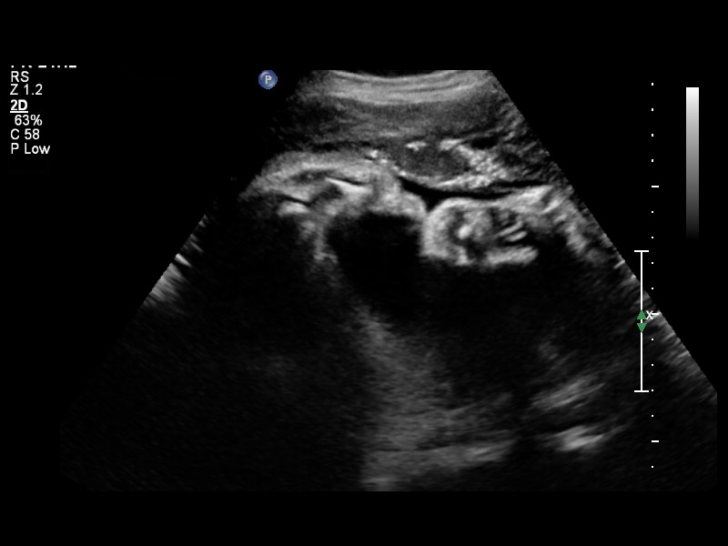
[im 12/17]
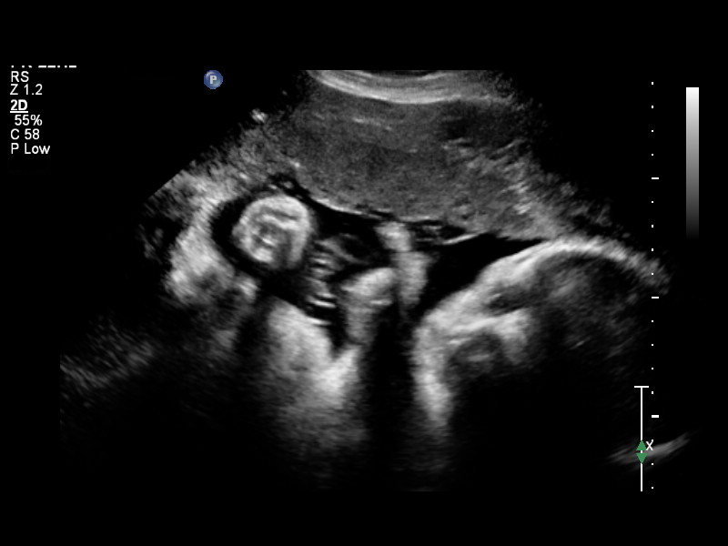
[im 13/17]
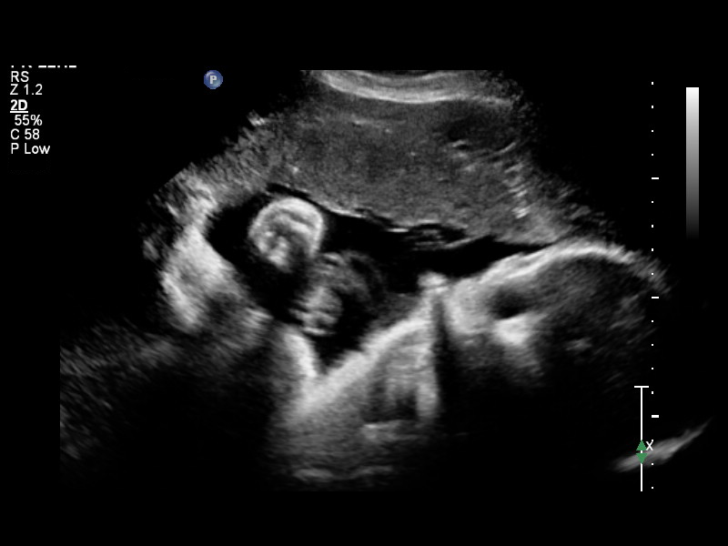
[im 15/17]
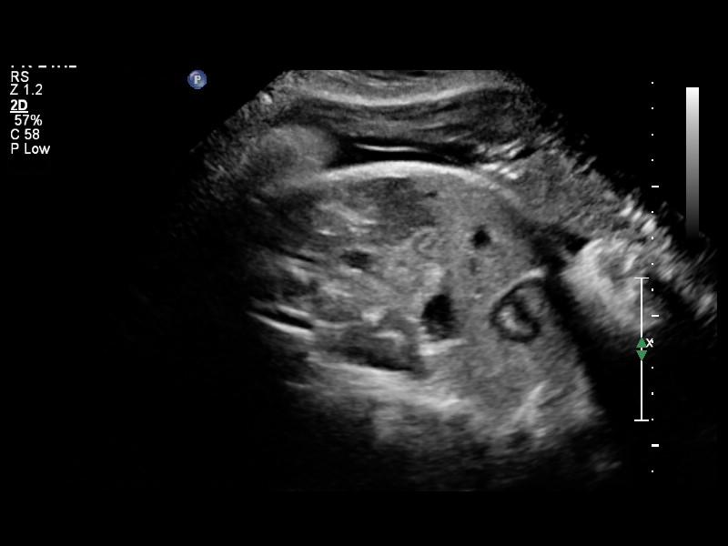
[im 17/17]
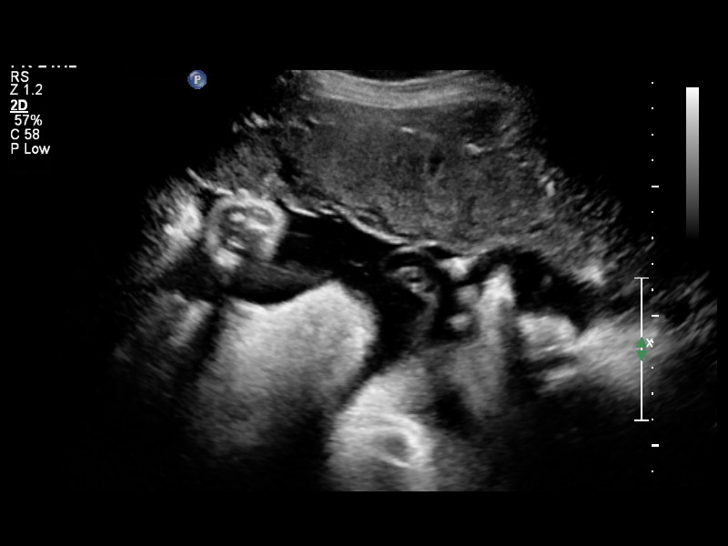

[13 of 21 positions shown; findings below may reference images not displayed]

OBSTETRICS REPORT
                      (Signed Final 01/05/2013 [DATE])

Service(s) Provided

Indications

 Advanced maternal age (41), Multigravida
 Diabetes - Gestational, A2 - glyburide
 Poor obstetric history: Previous preeclampsia /
 eclampsia/gestational HTN
Fetal Evaluation

 Num Of Fetuses:    1
 Fetal Heart Rate:  146                          bpm
 Cardiac Activity:  Observed
 Presentation:      Cephalic

 Amniotic Fluid
 AFI FV:      Subjectively within normal limits
 AFI Sum:     21.4    cm       86  %Tile     Larg Pckt:    7.58  cm
 RUQ:   7.58    cm   RLQ:    4.27   cm    LUQ:   5.44    cm   LLQ:    4.11   cm
Biophysical Evaluation

 Amniotic F.V:   Pocket => 2 cm two         F. Tone:        Observed
                 planes
 F. Movement:    Observed                   Score:          [DATE]
 F. Breathing:   Observed
Gestational Age

 LMP:           38w 2d        Date:  04/12/12                 EDD:   01/17/13
 Best:          38w 2d     Det. By:  Early Ultrasound         EDD:   01/17/13
Cervix Uterus Adnexa

 Cervix:       Not visualized (advanced GA >34 wks)
Impression

 Single IUP at 38 [DATE] weeks
 A2 GDM on glyburide
 Active fetus with BPP of [DATE]
 Normal amniotic fluid volume
Recommendations

 Continue 2x weekly NSTs with weekly AFIs
 Delivery at 39 weeks due to A2 GDM

## 2014-06-08 IMAGING — CT CT ANGIO HEAD
2 of 8 series · 6 of 33 positions shown · IV contrast (CONTRAST)
Comparison: CT HEAD W/O CM dated 02/01/2013

CLINICAL DATA: Sudden onset of severe headache. Negative CT head.
No significant red cells or xanthochromia on lumbar puncture.

EXAM:
CT ANGIOGRAPHY HEAD
TECHNIQUE: Multidetector CT imaging of the head was performed using the
standard protocol during bolus administration of intravenous
contrast. Multiplanar CT image reconstructions including MIPs were
obtained to evaluate the vascular anatomy.
CONTRAST:  50mL OMNIPAQUE IOHEXOL 350 MG/ML SOLN

[mpr, ax 1x1 mpr, axial · axial · 0.43mm/px · z∈[+292,+400]mm · 5 of 164 slices shown]
[im 28/164  soft-tissue]
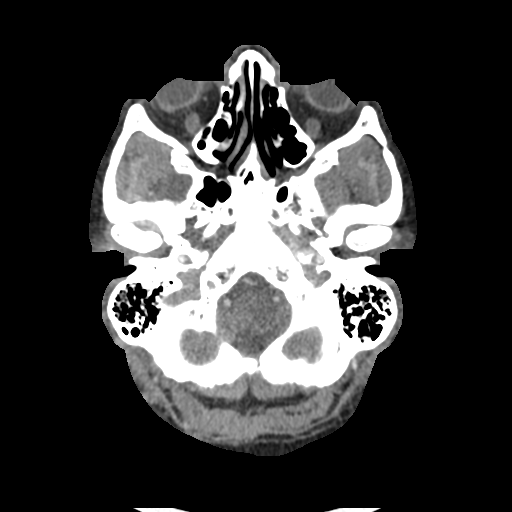
[im 55/164  bone]
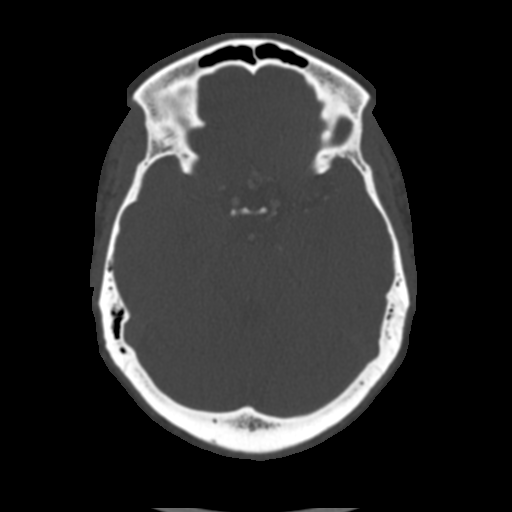
[im 82/164  soft-tissue]
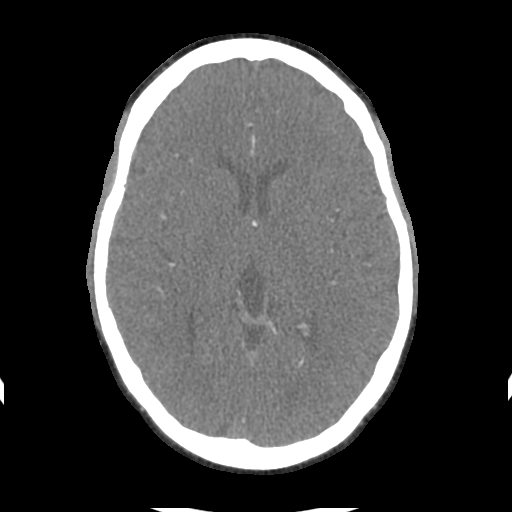
[im 109/164  bone]
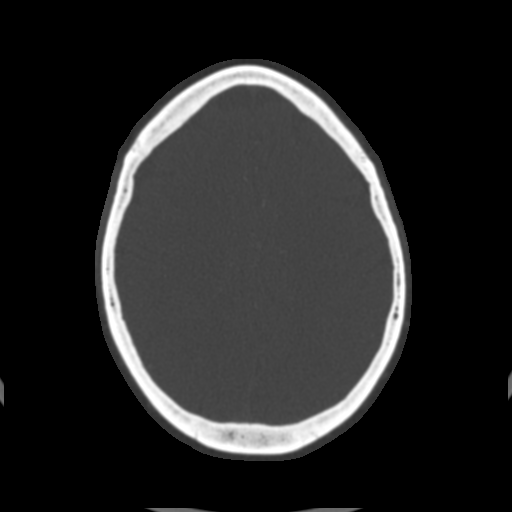
[im 136/164  soft-tissue]
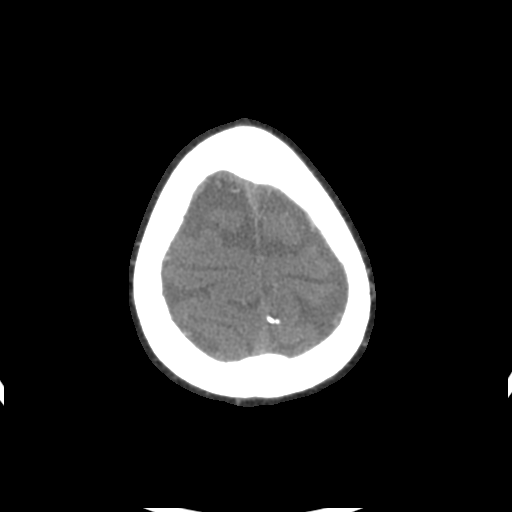

[mpr, sag 1x1 mpr, sagittal · sagittal · 0.43mm/px · 1 of 156 slices shown]
[im 78/156  soft-tissue]
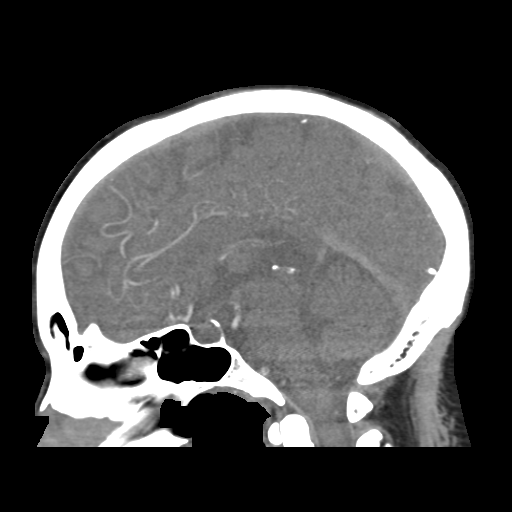

[6 of 33 positions shown; findings below may reference images not displayed]

FINDINGS: The internal carotid arteries are widely patent. The basilar artery
is widely patent with right vertebral slightly larger, both
vertebrals contributing. Dolichoectasia of the intracranial
vasculature without proximal stenosis, vessel occlusion, or
intracranial aneurysm. Incidental fetal origin left PCA. No
cerebellar branch occlusion. No visible asymmetry of distal MCA,
PCA, or ACA vessel caliber.

Post infusion, no abnormal enhancement of the brain or meninges.
Major dural venous sinuses patent. Calvarium intact. No sinus or
mastoid disease.

Review of the MIP images confirms the above findings.
IMPRESSION: Unremarkable CT angiography of the intracranial circulation.

## 2014-07-10 ENCOUNTER — Other Ambulatory Visit: Payer: Self-pay

## 2014-11-15 ENCOUNTER — Emergency Department (INDEPENDENT_AMBULATORY_CARE_PROVIDER_SITE_OTHER)
Admission: EM | Admit: 2014-11-15 | Discharge: 2014-11-15 | Disposition: A | Discharge status: Home / Self Care | Payer: No Typology Code available for payment source | Source: Home / Self Care

## 2014-11-15 DIAGNOSIS — L0292 Furuncle, unspecified: Secondary | ICD-10-CM

## 2014-11-15 MED ORDER — SULFAMETHOXAZOLE-TRIMETHOPRIM 800-160 MG PO TABS: 1.0000 | ORAL_TABLET | Freq: Two times a day (BID) | ORAL | Status: AC

## 2014-11-15 NOTE — Discharge Instructions (Signed)
Abscess °An abscess (boil or furuncle) is an infected area on or under the skin. This area is filled with yellowish-white fluid (pus) and other material (debris). °HOME CARE  °· Only take medicines as told by your doctor. °· If you were given antibiotic medicine, take it as directed. Finish the medicine even if you start to feel better. °· If gauze is used, follow your doctor's directions for changing the gauze. °· To avoid spreading the infection: °¨ Keep your abscess covered with a bandage. °¨ Wash your hands well. °¨ Do not share personal care items, towels, or whirlpools with others. °¨ Avoid skin contact with others. °· Keep your skin and clothes clean around the abscess. °· Keep all doctor visits as told. °GET HELP RIGHT AWAY IF:  °· You have more pain, puffiness (swelling), or redness in the wound site. °· You have more fluid or blood coming from the wound site. °· You have muscle aches, chills, or you feel sick. °· You have a fever. °MAKE SURE YOU:  °· Understand these instructions. °· Will watch your condition. °· Will get help right away if you are not doing well or get worse. °  °This information is not intended to replace advice given to you by your health care provider. Make sure you discuss any questions you have with your health care provider. °  °Document Released: 06/18/2007 Document Revised: 07/01/2011 Document Reviewed: 03/15/2011 °Elsevier Interactive Patient Education ©2016 [employer] ° °

## 2014-11-15 NOTE — ED Provider Notes (Signed)
CSN: [id number]     Arrival date & time 11/15/14  1835 History   None    Chief Complaint  Patient presents with  . Recurrent Skin Infections   (Consider location/radiation/quality/duration/timing/severity/associated sxs/prior Treatment) HPI History obtained from patient:   LOCATION:around vagina SEVERITY:4 DURATION:several days CONTEXT:sudden onset of itching, now with several boils  QUALITY:similar to previous episode 3 years ago MODIFYING FACTORS:using hydrocortisone cream without relief of symptoms ASSOCIATED SYMPTOMS: itching TIMING:constant  Past Medical History  Diagnosis Date  . Hypertension   . Asthma   . Gestational diabetes     glyburide  . Hepatitis B   . Headache(784.0)   . Migraine    Past Surgical History  Procedure Laterality Date  . No past surgeries     Family History  Problem Relation Age of Onset  . Diabetes Father   . Hypertension Father   . Stroke Father   . Asthma Brother   . Asthma Son   . Hearing loss Maternal Aunt   . Alcohol abuse Neg Hx   . Arthritis Neg Hx   . Birth defects Neg Hx   . Cancer Neg Hx   . COPD Neg Hx   . Depression Neg Hx   . Drug abuse Neg Hx   . Early death Neg Hx   . Heart disease Neg Hx   . Hyperlipidemia Neg Hx   . Kidney disease Neg Hx   . Learning disabilities Neg Hx   . Mental illness Neg Hx   . Mental retardation Neg Hx   . Miscarriages / Stillbirths Neg Hx   . Vision loss Neg Hx    Social History  Substance Use Topics  . Smoking status: Never Smoker   . Smokeless tobacco: Never Used  . Alcohol Use: No   OB History    Gravida Para Term Preterm AB TAB SAB Ectopic Multiple Living   2 2 1 1      2      Review of Systems ROS +'ve itching outer vaginal area  Denies: HEADACHE, NAUSEA, ABDOMINAL PAIN, CHEST PAIN, CONGESTION, DYSURIA, SHORTNESS OF BREATH  Allergies  Review of patient's allergies indicates no known allergies.  Home Medications   Prior to Admission medications   Medication Sig  Start Date End Date Taking? Authorizing Provider  butalbital-acetaminophen-caffeine (FIORICET) 50-325-40 MG per tablet Take 1 tablet by mouth every 6 (six) hours as needed for headache. 04/07/14   Joni ReiningNicole Pisciotta, PA-C  ibuprofen (ADVIL,MOTRIN) 200 MG tablet Take 4 tablets (800 mg total) by mouth every 8 (eight) hours as needed for headache or moderate pain. 02/02/13   Shanker Levora DredgeM Ghimire, MD  sulfamethoxazole-trimethoprim (BACTRIM DS,SEPTRA DS) 800-160 MG tablet Take 1 tablet by mouth 2 (two) times daily. 11/15/14 11/22/14  Tharon AquasFrank C Laurencia Roma, PA  verapamil (CALAN) 40 MG tablet Take 1 tablet (40 mg total) by mouth every 12 (twelve) hours. 02/02/13   Shanker Levora DredgeM Ghimire, MD   Meds Ordered and Administered this Visit  Medications - No data to display  BP 132/80 mmHg  Pulse 106  Temp(Src) 97.8 F (36.6 C) (Oral)  Resp 16  SpO2 100%  LMP 11/04/2014 (Exact Date) No data found.   Physical Exam  Constitutional: She is oriented to person, place, and time. She appears well-developed and well-nourished.  HENT:  Head: Normocephalic and atraumatic.  Abdominal: Soft. Bowel sounds are normal. There is no tenderness.  Musculoskeletal: Normal range of motion.  Neurological: She is alert and oriented to person, place, and time.  Skin:  Skin is warm and dry.  Psychiatric: She has a normal mood and affect. Her behavior is normal. Judgment and thought content normal.  Nursing note and vitals reviewed.  Pt states she would like to decline examination of the pelvic area.  ED Course  Procedures (including critical care time)  Labs Review Labs Reviewed - No data to display  Imaging Review No results found.   Visual Acuity Review  Right Eye Distance:   Left Eye Distance:   Bilateral Distance:    Right Eye Near:   Left Eye Near:    Bilateral Near:         MDM   1. Boil   Pt states she does not wish to have incision done at this time, does not feel the boil is ready.  Treat with Bactrim and hot  compresses.  Follow up if symptoms worsen.  She has been advised that she may require I&D of the lesion.  Discharged home in stable condition.    Tharon Aquas, PA 11/15/14 2008

## 2014-11-15 NOTE — ED Notes (Signed)
[facility] a [facility] boils in her genital areas.

## 2014-11-17 NOTE — ED Notes (Signed)
Pt  States  She never  Received  A  RX      WHEN   DISCHARGED    BILL     [facility]   NOTIFIED        RX  FOR  SEPTRA  DS      1  TAB   BID  TO     [facility]      [place]

## 2015-01-04 ENCOUNTER — Encounter (HOSPITAL_COMMUNITY): Payer: Self-pay | Admitting: *Deleted

## 2015-01-04 ENCOUNTER — Emergency Department (INDEPENDENT_AMBULATORY_CARE_PROVIDER_SITE_OTHER)
Admission: EM | Admit: 2015-01-04 | Discharge: 2015-01-04 | Disposition: A | Discharge status: Home / Self Care | Payer: No Typology Code available for payment source | Source: Home / Self Care | Attending: Family Medicine | Admitting: Family Medicine

## 2015-01-04 DIAGNOSIS — J069 Acute upper respiratory infection, unspecified: Secondary | ICD-10-CM

## 2015-01-04 LAB — POCT RAPID STREP A: Streptococcus, Group A Screen (Direct): NEGATIVE

## 2015-01-04 MED ORDER — AZITHROMYCIN 250 MG PO TABS: ORAL_TABLET | ORAL | Status: DC

## 2015-01-04 MED ORDER — IPRATROPIUM BROMIDE 0.06 % NA SOLN: 2.0000 | Freq: Four times a day (QID) | NASAL | Status: DC

## 2015-01-04 NOTE — ED Notes (Addendum)
Pt reports     sorethroat      As  Well  As pain  When     She  Coughs     wiith   Congestion         Cough   Sinus  Drainage          Symptoms  X  2  Days

## 2015-01-04 NOTE — Discharge Instructions (Signed)
Drink plenty of fluids as discussed, use medicine as prescribed, and mucinex or delsym for cough. Return or see your doctor if further problems °

## 2015-01-04 NOTE — ED Provider Notes (Signed)
CSN: [id number]     Arrival date & time 01/04/15  1745 History   First MD Initiated Contact with Patient 01/04/15 1753     Chief Complaint  Patient presents with  . Sore Throat   (Consider location/radiation/quality/duration/timing/severity/associated sxs/prior Treatment) Patient is a 44 y.o. female presenting with pharyngitis. The history is provided by the patient.  Sore Throat This is a new problem. The current episode started 2 days ago. The problem has not changed since onset.Pertinent negatives include no chest pain, no abdominal pain, no headaches and no shortness of breath.    Past Medical History  Diagnosis Date  . Hypertension   . Asthma   . Gestational diabetes     glyburide  . Hepatitis B   . Headache(784.0)   . Migraine    Past Surgical History  Procedure Laterality Date  . No past surgeries     Family History  Problem Relation Age of Onset  . Diabetes Father   . Hypertension Father   . Stroke Father   . Asthma Brother   . Asthma Son   . Hearing loss Maternal Aunt   . Alcohol abuse Neg Hx   . Arthritis Neg Hx   . Birth defects Neg Hx   . Cancer Neg Hx   . COPD Neg Hx   . Depression Neg Hx   . Drug abuse Neg Hx   . Early death Neg Hx   . Heart disease Neg Hx   . Hyperlipidemia Neg Hx   . Kidney disease Neg Hx   . Learning disabilities Neg Hx   . Mental illness Neg Hx   . Mental retardation Neg Hx   . Miscarriages / Stillbirths Neg Hx   . Vision loss Neg Hx    Social History  Substance Use Topics  . Smoking status: Never Smoker   . Smokeless tobacco: Never Used  . Alcohol Use: No   OB History    Gravida Para Term Preterm AB TAB SAB Ectopic Multiple Living   Review of Systems  Constitutional: Negative for fever and chills.  HENT: Positive for congestion, postnasal drip, rhinorrhea and sore throat.   Respiratory: Positive for cough. Negative for chest tightness, shortness of breath and wheezing.   Cardiovascular:  Negative.  Negative for chest pain.  Gastrointestinal: Negative for abdominal pain.  Neurological: Negative for headaches.  All other systems reviewed and are negative.   Allergies  Review of patient's allergies indicates no known allergies.  Home Medications   Prior to Admission medications   Medication Sig Start Date End Date Taking? Authorizing Provider  azithromycin (ZITHROMAX Z-PAK) 250 MG tablet Take as directed on pack 01/04/15   Linna Hoff, MD  butalbital-acetaminophen-caffeine (FIORICET) 2148807797 MG per tablet Take 1 tablet by mouth every 6 (six) hours as needed for headache. 04/07/14   Joni Reining Pisciotta, PA-C  ibuprofen (ADVIL,MOTRIN) 200 MG tablet Take 4 tablets (800 mg total) by mouth every 8 (eight) hours as needed for headache or moderate pain. 02/02/13   Shanker Levora Dredge, MD  ipratropium (ATROVENT) 0.06 % nasal spray Place 2 sprays into both nostrils 4 (four) times daily. 01/04/15   Linna Hoff, MD  verapamil (CALAN) 40 MG tablet Take 1 tablet (40 mg total) by mouth every 12 (twelve) hours. 02/02/13   Shanker Levora Dredge, MD   Meds Ordered and Administered this Visit  Medications - No data to display  BP 143/87 mmHg  Pulse 72  Temp(Src) 97.8 F (36.6 C) (Oral)  SpO2 98%  LMP 12/23/2014 No data found.   Physical Exam  Constitutional: She is oriented to person, place, and time. She appears well-developed and well-nourished.  HENT:  Head: Normocephalic.  Right Ear: External ear normal.  Left Ear: External ear normal.  Nose: Mucosal edema present.  Mouth/Throat: Oropharynx is clear and moist.  Eyes: Pupils are equal, round, and reactive to light.  Neck: Normal range of motion. Neck supple.  Cardiovascular: Regular rhythm and intact distal pulses.   Pulmonary/Chest: Breath sounds normal.  Lymphadenopathy:    She has no cervical adenopathy.  Neurological: She is alert and oriented to person, place, and time.  Skin: Skin is warm and dry.  Nursing note and  vitals reviewed.   ED Course  Procedures (including critical care time)  Labs Review Labs Reviewed  CULTURE, GROUP A STREP  POCT RAPID STREP A    Imaging Review No results found.   Visual Acuity Review  Right Eye Distance:   Left Eye Distance:   Bilateral Distance:    Right Eye Near:   Left Eye Near:    Bilateral Near:         MDM   1. URI (upper respiratory infection)        Linna HoffJames D Kindl, MD 01/04/15 2025

## 2015-01-06 LAB — CULTURE, GROUP A STREP: Strep A Culture: NEGATIVE

## 2015-02-19 DIAGNOSIS — E6609 Other obesity due to excess calories: Secondary | ICD-10-CM | POA: Insufficient documentation

## 2015-03-11 ENCOUNTER — Emergency Department (HOSPITAL_BASED_OUTPATIENT_CLINIC_OR_DEPARTMENT_OTHER): Payer: BLUE CROSS/BLUE SHIELD

## 2015-03-11 ENCOUNTER — Emergency Department (HOSPITAL_BASED_OUTPATIENT_CLINIC_OR_DEPARTMENT_OTHER)
Admission: EM | Admit: 2015-03-11 | Discharge: 2015-03-11 | Disposition: A | Discharge status: Home / Self Care | Payer: BLUE CROSS/BLUE SHIELD | Attending: Emergency Medicine | Admitting: Emergency Medicine

## 2015-03-11 ENCOUNTER — Encounter (HOSPITAL_BASED_OUTPATIENT_CLINIC_OR_DEPARTMENT_OTHER): Payer: Self-pay

## 2015-03-11 DIAGNOSIS — Z79899 Other long term (current) drug therapy: Secondary | ICD-10-CM | POA: Diagnosis not present

## 2015-03-11 DIAGNOSIS — H748X3 Other specified disorders of middle ear and mastoid, bilateral: Secondary | ICD-10-CM | POA: Diagnosis not present

## 2015-03-11 DIAGNOSIS — R0981 Nasal congestion: Secondary | ICD-10-CM | POA: Diagnosis present

## 2015-03-11 DIAGNOSIS — Z8619 Personal history of other infectious and parasitic diseases: Secondary | ICD-10-CM | POA: Insufficient documentation

## 2015-03-11 DIAGNOSIS — I10 Essential (primary) hypertension: Secondary | ICD-10-CM | POA: Diagnosis not present

## 2015-03-11 DIAGNOSIS — Z7984 Long term (current) use of oral hypoglycemic drugs: Secondary | ICD-10-CM | POA: Insufficient documentation

## 2015-03-11 DIAGNOSIS — Z8632 Personal history of gestational diabetes: Secondary | ICD-10-CM | POA: Insufficient documentation

## 2015-03-11 DIAGNOSIS — J45909 Unspecified asthma, uncomplicated: Secondary | ICD-10-CM | POA: Diagnosis not present

## 2015-03-11 DIAGNOSIS — J111 Influenza due to unidentified influenza virus with other respiratory manifestations: Secondary | ICD-10-CM | POA: Diagnosis not present

## 2015-03-11 DIAGNOSIS — R69 Illness, unspecified: Secondary | ICD-10-CM

## 2015-03-11 LAB — CBG MONITORING, ED: GLUCOSE-CAPILLARY: 151 mg/dL — AB (ref 65–99)

## 2015-03-11 MED ORDER — CETIRIZINE HCL 10 MG PO TABS: 10.0000 mg | ORAL_TABLET | Freq: Every day | ORAL | Status: DC

## 2015-03-11 MED ORDER — NAPROXEN 250 MG PO TABS: 250.0000 mg | ORAL_TABLET | Freq: Two times a day (BID) | ORAL | Status: DC

## 2015-03-11 NOTE — ED Provider Notes (Signed)
CSN: [id number]     Arrival date & time 03/11/15  1815 History   First MD Initiated Contact with Patient 03/11/15 2108     Chief Complaint  Patient presents with  . Nasal Congestion  . Sore Throat   Mary Richardson is a 45 y.o. female who presents to the emergency department complaining of 4 days of sneezing, nasal congestion, postnasal drip, cough and body aches. She denies any fevers. She reports sore throat with coughing but denies current sore throat. She did not get her flu shot this year. She took Mucinex yesterday without relief. The patient has taken nothing for treatment today. The patient denies fevers, trouble swallowing, headaches, neck stiffness, trouble breathing, wheezing, shortness of breath, chest pain, abdominal pain, nausea, vomiting, diarrhea, urinary symptoms or rashes.   Patient is a 45 y.o. female presenting with pharyngitis. The history is provided by the patient. No language interpreter was used.  Sore Throat Associated symptoms include arthralgias, congestion, coughing and myalgias. Pertinent negatives include no abdominal pain, chest pain, chills, fever, headaches, nausea, neck pain, numbness, rash, sore throat, vomiting or weakness.    Past Medical History  Diagnosis Date  . Hypertension   . Asthma   . Gestational diabetes     glyburide  . Hepatitis B   . Headache(784.0)   . Migraine    Past Surgical History  Procedure Laterality Date  . No past surgeries     Family History  Problem Relation Age of Onset  . Diabetes Father   . Hypertension Father   . Stroke Father   . Asthma Brother   . Asthma Son   . Hearing loss Maternal Aunt   . Alcohol abuse Neg Hx   . Arthritis Neg Hx   . Birth defects Neg Hx   . Cancer Neg Hx   . COPD Neg Hx   . Depression Neg Hx   . Drug abuse Neg Hx   . Early death Neg Hx   . Heart disease Neg Hx   . Hyperlipidemia Neg Hx   . Kidney disease Neg Hx   . Learning disabilities Neg Hx   . Mental illness Neg Hx   . Mental  retardation Neg Hx   . Miscarriages / Stillbirths Neg Hx   . Vision loss Neg Hx    Social History  Substance Use Topics  . Smoking status: Never Smoker   . Smokeless tobacco: Never Used  . Alcohol Use: No   OB History    Gravida Para Term Preterm AB TAB SAB Ectopic Multiple Living   Review of Systems  Constitutional: Negative for fever, chills and appetite change.  HENT: Positive for congestion, postnasal drip, rhinorrhea and sneezing. Negative for ear discharge, ear pain, mouth sores, sore throat, trouble swallowing and voice change.   Eyes: Negative for pain and visual disturbance.  Respiratory: Positive for cough. Negative for shortness of breath and wheezing.   Cardiovascular: Negative for chest pain.  Gastrointestinal: Negative for nausea, vomiting, abdominal pain and diarrhea.  Genitourinary: Negative for dysuria, hematuria and difficulty urinating.  Musculoskeletal: Positive for myalgias and arthralgias. Negative for back pain, neck pain and neck stiffness.  Skin: Negative for rash.  Neurological: Negative for syncope, weakness, light-headedness, numbness and headaches.      Allergies  Review of patient's allergies indicates no known allergies.  Home Medications   Prior to Admission medications   Medication Sig Start Date  End Date Taking? Authorizing Provider  lisinopril (PRINIVIL,ZESTRIL) 2.5 MG tablet Take 2.5 mg by mouth daily.   Yes Historical Provider, MD  metFORMIN (GLUCOPHAGE) 500 MG tablet Take by mouth 1 day or 1 dose.   Yes Historical Provider, MD  simvastatin (ZOCOR) 5 MG tablet Take 5 mg by mouth daily.   Yes Historical Provider, MD  butalbital-acetaminophen-caffeine (FIORICET) 50-325-40 MG per tablet Take 1 tablet by mouth every 6 (six) hours as needed for headache. 04/07/14   Joni Reining Pisciotta, PA-C  cetirizine (ZYRTEC ALLERGY) 10 MG tablet Take 1 tablet (10 mg total) by mouth daily. 03/11/15   Everlene Farrier, PA-C  ibuprofen  (ADVIL,MOTRIN) 200 MG tablet Take 4 tablets (800 mg total) by mouth every 8 (eight) hours as needed for headache or moderate pain. 02/02/13   Shanker Levora Dredge, MD  ipratropium (ATROVENT) 0.06 % nasal spray Place 2 sprays into both nostrils 4 (four) times daily. 01/04/15   Linna Hoff, MD  naproxen (NAPROSYN) 250 MG tablet Take 1 tablet (250 mg total) by mouth 2 (two) times daily with a meal. 03/11/15   Everlene Farrier, PA-C   BP 132/79 mmHg  Pulse 79  Temp(Src) 98.7 F (37.1 C) (Oral)  Resp 20  Ht  (1.702 m)  Wt 102.059 kg  BMI 35.23 kg/m2  SpO2 100%  LMP 03/09/2015 Physical Exam  Constitutional: She appears well-developed and well-nourished. No distress.  Nontoxic appearing.  HENT:  Head: Normocephalic and atraumatic.  Right Ear: External ear normal.  Left Ear: External ear normal.  Mouth/Throat: Oropharynx is clear and moist. No oropharyngeal exudate.  Mild bilateral tonsillar hypertrophy without exudates. Uvula is midline without edema. No trismus. Tongue protrusion is normal. No peritonsillar abscess. Mild middle ear effusion noted bilaterally. No TM erythema or loss of landmarks. Boggy nasal turbinates bilaterally. Rhinorrhea present.  Eyes: Conjunctivae and EOM are normal. Pupils are equal, round, and reactive to light. Right eye exhibits no discharge. Left eye exhibits no discharge.  Neck: Normal range of motion. Neck supple. No JVD present. No tracheal deviation present.  Cardiovascular: Normal rate, regular rhythm, normal heart sounds and intact distal pulses.  Exam reveals no gallop and no friction rub.   No murmur heard. Pulmonary/Chest: Effort normal and breath sounds normal. No respiratory distress. She has no wheezes. She has no rales.  Lungs are clear to auscultation bilaterally.  Abdominal: Soft. Bowel sounds are normal. There is no tenderness. There is no guarding.  Musculoskeletal: She exhibits no edema.  Lymphadenopathy:    She has no cervical adenopathy.   Neurological: She is alert. Coordination normal.  Skin: Skin is warm and dry. No rash noted. She is not diaphoretic. No erythema. No pallor.  Psychiatric: She has a normal mood and affect. Her behavior is normal.  Nursing note and vitals reviewed.   ED Course  Procedures (including critical care time) Labs Review Labs Reviewed  CBG MONITORING, ED - Abnormal; Notable for the following:    Glucose-Capillary 151 (*)    All other components within normal limits    Imaging Review Dg Chest 2 View  03/11/2015  CLINICAL DATA:  Sneezing, congestion, sore throat for 4 days. EXAM: CHEST  2 VIEW COMPARISON:  02/13/2015 FINDINGS: The heart size and mediastinal contours are within normal limits. Both lungs are clear. The visualized skeletal structures are unremarkable. IMPRESSION: No active cardiopulmonary disease. Electronically Signed   By: Gaylyn Rong M.D.   On: 03/11/2015 19:37   I have personally reviewed and evaluated these  images and lab results as part of my medical decision-making.   EKG Interpretation None      Filed Vitals:   03/11/15 1841 03/11/15 2121  BP: 132/85 132/79  Pulse: 86 79  Temp: 98.7 F (37.1 C)   TempSrc: Oral   Resp: 20 20  Height: 5\' 7"  (1.702 m)   Weight: 102.059 kg   SpO2: 100% 100%     MDM   Meds given in ED:  Medications - No data to display  New Prescriptions   CETIRIZINE (ZYRTEC ALLERGY) 10 MG TABLET    Take 1 tablet (10 mg total) by mouth daily.   NAPROXEN (NAPROSYN) 250 MG TABLET    Take 1 tablet (250 mg total) by mouth 2 (two) times daily with a meal.    Final diagnoses:  Influenza-like illness   This is a 45 y.o. female who presents to the emergency department complaining of 4 days of sneezing, nasal congestion, postnasal drip, cough and body aches. She denies any fevers. She reports sore throat with coughing but denies current sore throat. She did not get her flu shot this year. On exam the patient is afebrile nontoxic appearing.  No tonsillar exudate. Lungs are clear to auscultation bilaterally. No TM erythema. Chest x-ray is unremarkable. No pneumonia. Patient with influenza-like illness. She is out of range for Tamiflu. Will have her continue using Flonase. Will start her on Zyrtec and naproxen. I encouraged her to push fluids. Discussed strict and specific return precautions. I advised the patient to follow-up with their primary care provider this week. I advised the patient to return to the emergency department with new or worsening symptoms or new concerns. The patient verbalized understanding and agreement with plan.     Everlene Farrier, PA-C 03/11/15 2134  Tilden Fossa, MD 03/12/15 [phone]

## 2015-03-11 NOTE — ED Notes (Signed)
Patient here with sneezing, congestion, sore throat x 4 days, denies fever, no distress

## 2015-03-11 NOTE — Discharge Instructions (Signed)

## 2015-04-20 ENCOUNTER — Encounter (HOSPITAL_COMMUNITY): Payer: Self-pay | Admitting: Emergency Medicine

## 2015-04-20 ENCOUNTER — Ambulatory Visit (HOSPITAL_COMMUNITY)
Admission: EM | Admit: 2015-04-20 | Discharge: 2015-04-20 | Disposition: A | Discharge status: Home / Self Care | Payer: No Typology Code available for payment source | Attending: Emergency Medicine | Admitting: Emergency Medicine

## 2015-04-20 DIAGNOSIS — J302 Other seasonal allergic rhinitis: Secondary | ICD-10-CM

## 2015-04-20 NOTE — ED Notes (Signed)
PT reports sore throat and coughing since Sunday. PT reports cough is productive and she was coughing up white mucus and that has now turned yellow. Pt reports small amount of blood in mucus. PT reports she has tried OTC meds without relief.

## 2015-04-20 NOTE — ED Notes (Signed)
Provider at bedside

## 2015-04-20 NOTE — ED Provider Notes (Signed)
CSN: [id number]     Arrival date & time 04/20/15  1300 History   First MD Initiated Contact with Patient 04/20/15 1407     Chief Complaint  Patient presents with  . URI   (Consider location/radiation/quality/duration/timing/severity/associated sxs/prior Treatment) HPI Comments: 45 year old female with sore throat for one week. Associated with cough and posttussive emesis. Also complaining of PND. Denies shortness of breath or fever.   Past Medical History  Diagnosis Date  . Hypertension   . Asthma   . Gestational diabetes     glyburide  . Hepatitis B   . Headache(784.0)   . Migraine    Past Surgical History  Procedure Laterality Date  . No past surgeries     Family History  Problem Relation Age of Onset  . Diabetes Father   . Hypertension Father   . Stroke Father   . Asthma Brother   . Asthma Son   . Hearing loss Maternal Aunt   . Alcohol abuse Neg Hx   . Arthritis Neg Hx   . Birth defects Neg Hx   . Cancer Neg Hx   . COPD Neg Hx   . Depression Neg Hx   . Drug abuse Neg Hx   . Early death Neg Hx   . Heart disease Neg Hx   . Hyperlipidemia Neg Hx   . Kidney disease Neg Hx   . Learning disabilities Neg Hx   . Mental illness Neg Hx   . Mental retardation Neg Hx   . Miscarriages / Stillbirths Neg Hx   . Vision loss Neg Hx    Social History  Substance Use Topics  . Smoking status: Never Smoker   . Smokeless tobacco: Never Used  . Alcohol Use: No   OB History    Gravida Para Term Preterm AB TAB SAB Ectopic Multiple Living   Review of Systems  Constitutional: Negative for fever, chills, activity change, appetite change and fatigue.  HENT: Positive for congestion, postnasal drip, rhinorrhea and sore throat. Negative for ear discharge, ear pain and facial swelling.   Eyes: Negative.   Respiratory: Positive for cough. Negative for shortness of breath and wheezing.   Cardiovascular: Negative.   Musculoskeletal: Negative for neck pain and neck  stiffness.  Skin: Negative for pallor and rash.  Neurological: Negative.     Allergies  Review of patient's allergies indicates no known allergies.  Home Medications   Prior to Admission medications   Medication Sig Start Date End Date Taking? Authorizing Provider  lisinopril (PRINIVIL,ZESTRIL) 2.5 MG tablet Take 2.5 mg by mouth daily.   Yes Historical Provider, MD  metFORMIN (GLUCOPHAGE) 500 MG tablet Take by mouth 1 day or 1 dose.   Yes Historical Provider, MD  simvastatin (ZOCOR) 5 MG tablet Take 5 mg by mouth daily.   Yes Historical Provider, MD  butalbital-acetaminophen-caffeine (FIORICET) 50-325-40 MG per tablet Take 1 tablet by mouth every 6 (six) hours as needed for headache. 04/07/14   Joni Reining Pisciotta, PA-C  cetirizine (ZYRTEC ALLERGY) 10 MG tablet Take 1 tablet (10 mg total) by mouth daily. 03/11/15   Everlene Farrier, PA-C  ibuprofen (ADVIL,MOTRIN) 200 MG tablet Take 4 tablets (800 mg total) by mouth every 8 (eight) hours as needed for headache or moderate pain. 02/02/13   Shanker Levora Dredge, MD  ipratropium (ATROVENT) 0.06 % nasal spray Place 2 sprays into both nostrils 4 (four) times daily. 01/04/15   Fayrene Fearing  Sallyanne Kuster Kindl, MD  naproxen (NAPROSYN) 250 MG tablet Take 1 tablet (250 mg total) by mouth 2 (two) times daily with a meal. 03/11/15   Everlene FarrierWilliam Dansie, PA-C   Meds Ordered and Administered this Visit  Medications - No data to display  BP 125/87 mmHg  Pulse 77  Temp(Src) 99 F (37.2 C) (Oral)  Resp 16  Ht 5\' 6"  (1.676 m)  Wt 210 lb (95.255 kg)  BMI 33.91 kg/m2  SpO2 98% No data found.   Physical Exam  Constitutional: She is oriented to person, place, and time. She appears well-developed and well-nourished. No distress.  HENT:  Mouth/Throat: No oropharyngeal exudate.  Bilateral TMs are normal Posterior pharynx with moderate PND, mild erythema and cobblestoning.  Eyes: Conjunctivae and EOM are normal.  Neck: Normal range of motion. Neck supple.  Cardiovascular: Normal  rate, regular rhythm and normal heart sounds.   Pulmonary/Chest: Effort normal and breath sounds normal. No respiratory distress. She has no wheezes. She has no rales.  Musculoskeletal: Normal range of motion. She exhibits no edema.  Lymphadenopathy:    She has no cervical adenopathy.  Neurological: She is alert and oriented to person, place, and time.  Skin: Skin is warm and dry. No rash noted.  Psychiatric: She has a normal mood and affect.  Nursing note and vitals reviewed.   ED Course  Procedures (including critical care time)  Labs Review Labs Reviewed - No data to display  Imaging Review No results found.   Visual Acuity Review  Right Eye Distance:   Left Eye Distance:   Bilateral Distance:    Right Eye Near:   Left Eye Near:    Bilateral Near:         MDM   1. Other seasonal allergic rhinitis    Allergic Rhinitis For nasal and head congestion may take Sudafed PE 10 mg every 4 hours as needed. Saline nasal spray used frequently. For drainage may use Allegra, Claritin or Zyrtec. If you need stronger medicine to stop drainage may take Chlor-Trimeton 2-4 mg every 4 hours. This may cause drowsiness. Ibuprofen 600 mg every 6 hours as needed for pain, discomfort or fever. Drink plenty of fluids and stay well-hydrated. Flonase or Rhinocort  Nasal spray    Hayden Rasmussenavid Daymien Goth, NP 04/20/15 1422

## 2015-04-20 NOTE — Discharge Instructions (Signed)
Allergic Rhinitis For nasal and head congestion may take Sudafed PE 10 mg every 4 hours as needed. Saline nasal spray used frequently. For drainage may use Allegra, Claritin or Zyrtec. If you need stronger medicine to stop drainage may take Chlor-Trimeton 2-4 mg every 4 hours. This may cause drowsiness. Ibuprofen 600 mg every 6 hours as needed for pain, discomfort or fever. Drink plenty of fluids and stay well-hydrated. Flonase or Rhinocort  Nasal spray  Allergic rhinitis is when the mucous membranes in the nose respond to allergens. Allergens are particles in the air that cause your body to have an allergic reaction. This causes you to release allergic antibodies. Through a chain of events, these eventually cause you to release histamine into the blood stream. Although meant to protect the body, it is this release of histamine that causes your discomfort, such as frequent sneezing, congestion, and an itchy, runny nose.  CAUSES Seasonal allergic rhinitis (hay fever) is caused by pollen allergens that may come from grasses, trees, and weeds. Year-round allergic rhinitis (perennial allergic rhinitis) is caused by allergens such as house dust mites, pet dander, and mold spores. SYMPTOMS  Nasal stuffiness (congestion).  Itchy, runny nose with sneezing and tearing of the eyes. DIAGNOSIS Your health care provider can help you determine the allergen or allergens that trigger your symptoms. If you and your health care provider are unable to determine the allergen, skin or blood testing may be used. Your health care provider will diagnose your condition after taking your health history and performing a physical exam. Your health care provider may assess you for other related conditions, such as asthma, pink eye, or an ear infection. TREATMENT Allergic rhinitis does not have a cure, but it can be controlled by:  Medicines that block allergy symptoms. These may include allergy shots, nasal sprays, and oral  antihistamines.  Avoiding the allergen. Hay fever may often be treated with antihistamines in pill or nasal spray forms. Antihistamines block the effects of histamine. There are over-the-counter medicines that may help with nasal congestion and swelling around the eyes. Check with your health care provider before taking or giving this medicine. If avoiding the allergen or the medicine prescribed do not work, there are many new medicines your health care provider can prescribe. Stronger medicine may be used if initial measures are ineffective. Desensitizing injections can be used if medicine and avoidance does not work. Desensitization is when a patient is given ongoing shots until the body becomes less sensitive to the allergen. Make sure you follow up with your health care provider if problems continue. HOME CARE INSTRUCTIONS It is not possible to completely avoid allergens, but you can reduce your symptoms by taking steps to limit your exposure to them. It helps to know exactly what you are allergic to so that you can avoid your specific triggers. SEEK MEDICAL CARE IF:  You have a fever.  You develop a cough that does not stop easily (persistent).  You have shortness of breath.  You start wheezing.  Symptoms interfere with normal daily activities.   This information is not intended to replace advice given to you by your health care provider. Make sure you discuss any questions you have with your health care provider.   Document Released: 09/24/2000 Document Revised: 01/20/2014 Document Reviewed: 09/06/2012 Elsevier Interactive Patient Education [employer]2016 Elsevier Inc.

## 2015-08-28 DIAGNOSIS — E119 Type 2 diabetes mellitus without complications: Secondary | ICD-10-CM | POA: Insufficient documentation

## 2015-08-28 DIAGNOSIS — E785 Hyperlipidemia, unspecified: Secondary | ICD-10-CM | POA: Insufficient documentation

## 2016-03-13 DIAGNOSIS — E559 Vitamin D deficiency, unspecified: Secondary | ICD-10-CM | POA: Insufficient documentation

## 2016-03-13 DIAGNOSIS — I10 Essential (primary) hypertension: Secondary | ICD-10-CM | POA: Insufficient documentation

## 2016-03-13 DIAGNOSIS — D649 Anemia, unspecified: Secondary | ICD-10-CM | POA: Insufficient documentation

## 2016-11-28 ENCOUNTER — Other Ambulatory Visit: Payer: Self-pay

## 2016-11-28 ENCOUNTER — Emergency Department (HOSPITAL_BASED_OUTPATIENT_CLINIC_OR_DEPARTMENT_OTHER)
Admission: EM | Admit: 2016-11-28 | Discharge: 2016-11-28 | Disposition: A | Discharge status: Home / Self Care | Payer: BLUE CROSS/BLUE SHIELD | Attending: Emergency Medicine | Admitting: Emergency Medicine

## 2016-11-28 ENCOUNTER — Encounter (HOSPITAL_BASED_OUTPATIENT_CLINIC_OR_DEPARTMENT_OTHER): Payer: Self-pay | Admitting: Emergency Medicine

## 2016-11-28 DIAGNOSIS — M79644 Pain in right finger(s): Secondary | ICD-10-CM | POA: Diagnosis not present

## 2016-11-28 DIAGNOSIS — Z7984 Long term (current) use of oral hypoglycemic drugs: Secondary | ICD-10-CM | POA: Insufficient documentation

## 2016-11-28 DIAGNOSIS — Z79899 Other long term (current) drug therapy: Secondary | ICD-10-CM | POA: Diagnosis not present

## 2016-11-28 DIAGNOSIS — J45909 Unspecified asthma, uncomplicated: Secondary | ICD-10-CM | POA: Diagnosis not present

## 2016-11-28 DIAGNOSIS — M25562 Pain in left knee: Secondary | ICD-10-CM | POA: Diagnosis present

## 2016-11-28 DIAGNOSIS — I10 Essential (primary) hypertension: Secondary | ICD-10-CM | POA: Diagnosis not present

## 2016-11-28 DIAGNOSIS — M1990 Unspecified osteoarthritis, unspecified site: Secondary | ICD-10-CM | POA: Insufficient documentation

## 2016-11-28 MED ORDER — HYDROCODONE-ACETAMINOPHEN 5-325 MG PO TABS: 1.0000 | ORAL_TABLET | Freq: Four times a day (QID) | ORAL | 0 refills | Status: DC | PRN

## 2016-11-28 NOTE — ED Provider Notes (Signed)
[facility] EMERGENCY DEPARTMENT Provider Note   CSN: [id number] Arrival date & time: 11/28/16  1813     History   Chief Complaint Chief Complaint  Patient presents with  . Joint Pain    HPI Chester Holsteindeye Reveron is a 46 y.o. female history of asthma, arthritis here presenting with left knee pain, right thumb pain.  Patient states that she has history of arthritis and she has worsening left knee pain for the last week or so.  Several days ago, patient has some right thumb pain and went to urgent care was thought to have a sprain of the right thumb after normal x-rays.  Patient states that she was prescribed meloxicam, tramadol but it did not help with the pain.  Patient denies any fall or new injuries.  Patient had tried physical therapy before with minimal relief.  Denies any fevers or chills.  The history is provided by the patient.    Past Medical History:  Diagnosis Date  . Asthma   . Gestational diabetes    glyburide  . Headache(784.0)   . Hepatitis B   . Hypertension   . Migraine     Patient Active Problem List   Diagnosis Date Noted  . Headache 02/02/2013  . NVD (normal vaginal delivery) 01/12/2013  . Gestational diabetes mellitus in childbirth 01/11/2013    Past Surgical History:  Procedure Laterality Date  . NO PAST SURGERIES      OB History    Gravida Para Term Preterm AB Living   2 2 1 1   2    SAB TAB Ectopic Multiple Live Births           2       Home Medications    Prior to Admission medications   Medication Sig Start Date End Date Taking? Authorizing Provider  meloxicam (MOBIC) 15 MG tablet Take 15 mg daily by mouth.   Yes [provider]  traMADol (ULTRAM-ER) 100 MG 24 hr tablet Take 100 mg daily by mouth.   Yes [provider]  butalbital-acetaminophen-caffeine (FIORICET) 50-325-40 MG per tablet Take 1 tablet by mouth every 6 (six) hours as needed for headache. 04/07/14   Pisciotta, Joni ReiningNicole, PA-C  cetirizine (ZYRTEC ALLERGY)  10 MG tablet Take 1 tablet (10 mg total) by mouth daily. 03/11/15   Everlene Farrieransie, William, PA-C  ibuprofen (ADVIL,MOTRIN) 200 MG tablet Take 4 tablets (800 mg total) by mouth every 8 (eight) hours as needed for headache or moderate pain. 02/02/13   Ghimire, Werner LeanShanker M, MD  ipratropium (ATROVENT) 0.06 % nasal spray Place 2 sprays into both nostrils 4 (four) times daily. 01/04/15   Linna HoffKindl, James D, MD  lisinopril (PRINIVIL,ZESTRIL) 2.5 MG tablet Take 2.5 mg by mouth daily.    [provider]  metFORMIN (GLUCOPHAGE) 500 MG tablet Take by mouth 1 day or 1 dose.    [provider]  naproxen (NAPROSYN) 250 MG tablet Take 1 tablet (250 mg total) by mouth 2 (two) times daily with a meal. 03/11/15   Everlene Farrieransie, William, PA-C  simvastatin (ZOCOR) 5 MG tablet Take 5 mg by mouth daily.    [provider]    Family History Family History  Problem Relation Age of Onset  . Diabetes Father   . Hypertension Father   . Stroke Father   . Asthma Brother   . Asthma Son   . Hearing loss Maternal Aunt   . Alcohol abuse Neg Hx   . Arthritis Neg Hx   .  Birth defects Neg Hx   . Cancer Neg Hx   . COPD Neg Hx   . Depression Neg Hx   . Drug abuse Neg Hx   . Early death Neg Hx   . Heart disease Neg Hx   . Hyperlipidemia Neg Hx   . Kidney disease Neg Hx   . Learning disabilities Neg Hx   . Mental illness Neg Hx   . Mental retardation Neg Hx   . Miscarriages / Stillbirths Neg Hx   . Vision loss Neg Hx     Social History Social History   Tobacco Use  . Smoking status: Never Smoker  . Smokeless tobacco: Never Used  Substance Use Topics  . Alcohol use: No  . Drug use: No     Allergies   Patient has no known allergies.   Review of Systems Review of Systems  Musculoskeletal:       L knee pain, R thumb pain   All other systems reviewed and are negative.    Physical Exam Updated Vital Signs BP 125/70 (BP Location: Left Arm)   Pulse 70   Temp 98.4 F (36.9 C) (Oral)   Resp 18    Ht 5\' 7"  (1.702 m)   Wt 98 kg (216 lb)   LMP 11/21/2016   SpO2 100%   BMI 33.83 kg/m   Physical Exam  Constitutional: She appears well-developed.  HENT:  Head: Normocephalic.  Mouth/Throat: Oropharynx is clear and moist.  Eyes: Pupils are equal, round, and reactive to light.  Neck: Normal range of motion.  Cardiovascular: Normal rate.  Pulmonary/Chest: Effort normal.  Abdominal: Soft.  Musculoskeletal:  R thumb dec ROM, no deformity. L knee dec ROM but no deformity. Neurovascular intact throughout   Neurological: She is alert.  Skin: Skin is warm.  Psychiatric: She has a normal mood and affect.  Nursing note and vitals reviewed.    ED Treatments / Results  Labs (all labs ordered are listed, but only abnormal results are displayed) Labs Reviewed - No data to display  EKG  EKG Interpretation None       Radiology No results found.  Procedures Procedures (including critical care time)  Medications Ordered in ED Medications - No data to display   Initial Impression / Assessment and Plan / ED Course  I have reviewed the triage vital signs and the nursing notes.  Pertinent labs & imaging results that were available during my care of the patient were reviewed by me and considered in my medical decision making (see chart for details).     Chester Holsteindeye Masterson is a 46 y.o. female here with joint pain. Hx of arthritis. Well appearing. No obvious joint swelling. Afebrile. Will not need workup in the ED. Recommend ortho follow up, continue meloxican. Will give vicodin for severe pain    Final Clinical Impressions(s) / ED Diagnoses   Final diagnoses:  None    ED Discharge Orders    None       Charlynne PanderYao, Erminio Nygard Hsienta, MD 11/28/16 2035

## 2016-11-28 NOTE — ED Triage Notes (Signed)
Patient states that she had arthritis to her bilateral knees and her left hand and thumb. Patient states that she is having horrible pain today to her joint

## 2016-11-28 NOTE — ED Notes (Signed)
Pt teaching provided on medications that may cause drowsiness. Pt instructed not to drive or operate heavy machinery while taking the prescribed medication. Pt verbalized understanding.   

## 2016-11-28 NOTE — Discharge Instructions (Signed)
Continue meloxican.   Take vicodin for severe pain   See your doctor and orthopedic doctor  Return to ER if you have worse joint pain, fever, joint swelling

## 2016-12-01 ENCOUNTER — Encounter (HOSPITAL_COMMUNITY): Payer: Self-pay | Admitting: *Deleted

## 2016-12-01 ENCOUNTER — Emergency Department (HOSPITAL_COMMUNITY): Payer: BLUE CROSS/BLUE SHIELD

## 2016-12-01 ENCOUNTER — Emergency Department (HOSPITAL_COMMUNITY)
Admission: EM | Admit: 2016-12-01 | Discharge: 2016-12-02 | Disposition: A | Discharge status: Home / Self Care | Payer: BLUE CROSS/BLUE SHIELD | Attending: Emergency Medicine | Admitting: Emergency Medicine

## 2016-12-01 DIAGNOSIS — J45909 Unspecified asthma, uncomplicated: Secondary | ICD-10-CM | POA: Diagnosis not present

## 2016-12-01 DIAGNOSIS — I10 Essential (primary) hypertension: Secondary | ICD-10-CM | POA: Insufficient documentation

## 2016-12-01 DIAGNOSIS — M5432 Sciatica, left side: Secondary | ICD-10-CM | POA: Diagnosis not present

## 2016-12-01 DIAGNOSIS — M545 Low back pain: Secondary | ICD-10-CM | POA: Diagnosis present

## 2016-12-01 NOTE — ED Triage Notes (Signed)
Pt states for 5-6 days she has been having left lower back pain that radiates through her buttocks and down the back of her left leg to her foot. She has had numbness and tingling. No urinary or bowel incontinence. Has been taking prescribed med, but does not help the pain, only makes her sleepy.

## 2016-12-01 NOTE — ED Triage Notes (Signed)
Per [facility] report that patient is having a "cramping, nerve pain" from her left lower back that goes down the back of the leg to the knee area. She was seen for the same on the 16th, pain meds not helping.

## 2016-12-02 MED ORDER — PREDNISONE 50 MG PO TABS: 50.0000 mg | ORAL_TABLET | Freq: Every day | ORAL | 0 refills | Status: DC

## 2016-12-02 MED ORDER — CYCLOBENZAPRINE HCL 10 MG PO TABS: 10.0000 mg | ORAL_TABLET | Freq: Two times a day (BID) | ORAL | 0 refills | Status: DC | PRN

## 2016-12-02 MED ORDER — MELOXICAM 15 MG PO TABS
15.0000 mg | ORAL_TABLET | Freq: Every day | ORAL | 0 refills | Status: DC
Start: 2016-12-02 — End: 2017-02-05

## 2016-12-02 NOTE — ED Provider Notes (Signed)
[facility]-EMERGENCY DEPT Provider Note   CSN: [id number] Arrival date & time: 12/01/16  1953     History   Chief Complaint Chief Complaint  Patient presents with  . Leg Pain    HPI Mary Richardson is a 46 y.o. female.  HPI Patient presents to the emergency room for evaluation of left lower back pain that is radiating down her left leg.  Patient is feeling pain in her thigh as well as her lower leg.  Certain movements increase the pain.  She she is felt tingling in her leg but denies any weakness.  No incontinence. Past Medical History:  Diagnosis Date  . Asthma   . Gestational diabetes    glyburide  . Headache(784.0)   . Hepatitis B   . Hypertension   . Migraine     Patient Active Problem List   Diagnosis Date Noted  . Headache 02/02/2013  . NVD (normal vaginal delivery) 01/12/2013  . Gestational diabetes mellitus in childbirth 01/11/2013    Past Surgical History:  Procedure Laterality Date  . NO PAST SURGERIES      OB History    Gravida Para Term Preterm AB Living   2 2 1 1   2    SAB TAB Ectopic Multiple Live Births           2       Home Medications    Prior to Admission medications   Medication Sig Start Date End Date Taking? Authorizing Provider  HYDROcodone-acetaminophen (NORCO/VICODIN) 5-325 MG tablet Take 1 tablet every 6 (six) hours as needed by mouth. Patient taking differently: Take 1 tablet every 6 (six) hours as needed by mouth for moderate pain or severe pain.  11/28/16  Yes Charlynne Pander, MD  cetirizine (ZYRTEC ALLERGY) 10 MG tablet Take 1 tablet (10 mg total) by mouth daily. Patient not taking: Reported on 12/01/2016 03/11/15   Everlene Farrier, PA-C  cyclobenzaprine (FLEXERIL) 10 MG tablet Take 1 tablet (10 mg total) 2 (two) times daily as needed by mouth for muscle spasms. 12/02/16   Linwood Dibbles, MD  ipratropium (ATROVENT) 0.06 % nasal spray Place 2 sprays into both nostrils 4 (four) times daily. Patient not taking:  Reported on 12/01/2016 01/04/15   Linna Hoff, MD  meloxicam (MOBIC) 15 MG tablet Take 1 tablet (15 mg total) daily by mouth. 12/02/16   Linwood Dibbles, MD  predniSONE (DELTASONE) 50 MG tablet Take 1 tablet (50 mg total) daily by mouth. 12/02/16   Linwood Dibbles, MD    Family History Family History  Problem Relation Age of Onset  . Diabetes Father   . Hypertension Father   . Stroke Father   . Asthma Brother   . Asthma Son   . Hearing loss Maternal Aunt   . Alcohol abuse Neg Hx   . Arthritis Neg Hx   . Birth defects Neg Hx   . Cancer Neg Hx   . COPD Neg Hx   . Depression Neg Hx   . Drug abuse Neg Hx   . Early death Neg Hx   . Heart disease Neg Hx   . Hyperlipidemia Neg Hx   . Kidney disease Neg Hx   . Learning disabilities Neg Hx   . Mental illness Neg Hx   . Mental retardation Neg Hx   . Miscarriages / Stillbirths Neg Hx   . Vision loss Neg Hx     Social History Social History   Tobacco Use  . Smoking status:  Never Smoker  . Smokeless tobacco: Never Used  Substance Use Topics  . Alcohol use: No  . Drug use: No     Allergies   Patient has no known allergies.   Review of Systems Review of Systems  All other systems reviewed and are negative.    Physical Exam Updated Vital Signs BP 131/83   Pulse 68   Temp 98.4 F (36.9 C) (Oral)   Resp 18   Ht 1.702 m (5\' 7" )   Wt 98 kg (216 lb)   LMP 11/21/2016 Comment: waiver signed  SpO2 100%   BMI 33.83 kg/m   Physical Exam  Constitutional: She appears well-developed and well-nourished.  HENT:  Head: Normocephalic and atraumatic.  Right Ear: External ear normal.  Left Ear: External ear normal.  Nose: Nose normal.  Eyes: Conjunctivae and EOM are normal.  Neck: Neck supple. No tracheal deviation present.  Pulmonary/Chest: Effort normal. No stridor. No respiratory distress.  Musculoskeletal: She exhibits no edema or tenderness.       Lumbar back: She exhibits decreased range of motion, pain and spasm. She  exhibits no swelling and no edema.  Neurological: She is alert. She is not disoriented. No cranial nerve deficit or sensory deficit. She exhibits normal muscle tone. Coordination normal.  Skin: Skin is warm and dry. No rash noted. She is not diaphoretic. No erythema.  Psychiatric: She has a normal mood and affect. Her behavior is normal. Thought content normal.  Nursing note and vitals reviewed.    ED Treatments / Results   Radiology Dg Lumbar Spine Complete  Result Date: 12/01/2016 CLINICAL DATA:  Pain radiating from lumbar spine to left proximal and lateral tibia and fibula x6 days. No known trauma. EXAM: LUMBAR SPINE - COMPLETE 4+ VIEW COMPARISON:  03/13/2016 FINDINGS: There is no evidence of lumbar spine fracture. Mild levoconvex curvature of the lumbar spine likely positional. No pars defects. No fracture or listhesis. Alignment is normal. Intervertebral disc spaces are maintained. IMPRESSION: No radiographic findings to explain the patient's radicular pain. Electronically Signed   By: Tollie Ethavid  Kwon M.D.   On: 12/01/2016 23:33   Dg Tibia/fibula Left  Result Date: 12/01/2016 CLINICAL DATA:  Pain radiating to the lumbar spine and of the left leg for 6 days. No trauma. EXAM: LEFT TIBIA AND FIBULA - 2 VIEW COMPARISON:  None. FINDINGS: Degenerative changes in the left knee with medial compartment narrowing and osteophyte formation. No evidence of acute fracture or dislocation of the left tibia or fibula. No focal bone lesion or bone destruction. Soft tissues are unremarkable. IMPRESSION: No acute bony abnormalities. Electronically Signed   By: Burman NievesWilliam  Stevens M.D.   On: 12/01/2016 23:33   Dg Femur Min 2 Views Left  Result Date: 12/01/2016 CLINICAL DATA:  Pain radiating from the lumbar spine and of the left proximal and lateral tib-fib for 6 days. No trauma. EXAM: LEFT FEMUR 2 VIEWS COMPARISON:  None. FINDINGS: Mild degenerative changes in the left knee with medial compartment narrowing. There  is no evidence of fracture or other focal bone lesions. Soft tissues are unremarkable. IMPRESSION: No acute bony abnormalities. Electronically Signed   By: Burman NievesWilliam  Stevens M.D.   On: 12/01/2016 23:32    Procedures Procedures (including critical care time)  Medications Ordered in ED Medications - No data to display   Initial Impression / Assessment and Plan / ED Course  I have reviewed the triage vital signs and the nursing notes.  Pertinent labs & imaging results that were available  during my care of the patient were reviewed by me and considered in my medical decision making (see chart for details).    No sign of acute neurological or vascular emergency associated with pt's back pain.  May have a component of sciatica.  Safe for outpatient follow up.   Final Clinical Impressions(s) / ED Diagnoses   Final diagnoses:  Sciatica of left side    ED Discharge Orders        Ordered    predniSONE (DELTASONE) 50 MG tablet  Daily     12/02/16 0012    cyclobenzaprine (FLEXERIL) 10 MG tablet  2 times daily PRN     12/02/16 0012    meloxicam (MOBIC) 15 MG tablet  Daily     12/02/16 0012       Linwood DibblesKnapp, Arta Stump, MD 12/02/16 [phone]

## 2016-12-02 NOTE — Discharge Instructions (Signed)
Take the medications as prescribed.  Follow-up with your primary care doctor if the symptoms are not improving to discuss further treatment

## 2016-12-02 NOTE — ED Notes (Signed)
Pt ambulatory and independent at discharge.  Verbalized understanding of discharge instructions 

## 2016-12-03 ENCOUNTER — Ambulatory Visit (HOSPITAL_BASED_OUTPATIENT_CLINIC_OR_DEPARTMENT_OTHER)
Admission: RE | Admit: 2016-12-03 | Discharge: 2016-12-03 | Disposition: A | Discharge status: Home / Self Care | Payer: BLUE CROSS/BLUE SHIELD | Source: Ambulatory Visit | Attending: Family Medicine | Admitting: Family Medicine

## 2016-12-03 ENCOUNTER — Ambulatory Visit: Payer: BLUE CROSS/BLUE SHIELD | Admitting: Family Medicine

## 2016-12-03 ENCOUNTER — Encounter: Payer: Self-pay | Admitting: Family Medicine

## 2016-12-03 VITALS — BP 119/76 | HR 79 | Ht 66.0 in | Wt 216.0 lb

## 2016-12-03 DIAGNOSIS — M25562 Pain in left knee: Secondary | ICD-10-CM | POA: Diagnosis not present

## 2016-12-03 DIAGNOSIS — M7122 Synovial cyst of popliteal space [Baker], left knee: Secondary | ICD-10-CM | POA: Diagnosis not present

## 2016-12-03 DIAGNOSIS — M79605 Pain in left leg: Secondary | ICD-10-CM | POA: Diagnosis present

## 2016-12-03 MED ORDER — METHYLPREDNISOLONE ACETATE 40 MG/ML IJ SUSP
40.0000 mg | Freq: Once | INTRAMUSCULAR | Status: AC
  Administered 2016-12-03: 40 mg via INTRA_ARTICULAR

## 2016-12-06 ENCOUNTER — Encounter: Payer: Self-pay | Admitting: Family Medicine

## 2016-12-06 DIAGNOSIS — M179 Osteoarthritis of knee, unspecified: Secondary | ICD-10-CM | POA: Insufficient documentation

## 2016-12-06 DIAGNOSIS — M1710 Unilateral primary osteoarthritis, unspecified knee: Secondary | ICD-10-CM | POA: Insufficient documentation

## 2016-12-06 DIAGNOSIS — M25562 Pain in left knee: Secondary | ICD-10-CM

## 2016-12-06 NOTE — Assessment & Plan Note (Signed)
Independently reviewed radiographs and noted arthritic changes.  Baker's cyst seen on ultrasound but ordered doppler given severity of her pain and not responding to conservative treatment to date.  Doppler negative for DVT.  Intraarticular injection given (suspect arthritis caused cyst based on otherwise reassuring exam).  Advised if still not improving over next 1-2 weeks would consider aspiration/injection of cyst directly with ultrasound guidance.  Finish prednisone.  Tramadol, flexeril only if needed.    After informed written consent timeout was performed, patient was seated on exam table. Left knee was prepped with alcohol swab and utilizing anteromedial approach, patient's left knee was injected intraarticularly with 3:1 bupivicaine: depomedrol. Patient tolerated the procedure well without immediate complications.

## 2016-12-06 NOTE — Progress Notes (Signed)
PCP: System, Pcp Not In  Subjective:   HPI: Patient is a 46 y.o. female here for left leg pain.  Patient reports she's had about 1 week of worsening left knee/leg pain. Started anterior to posterior. Feels the pain go up to buttock at times. Associated cramping. Pain level 9/10 and sharp, difficulty walking. Worse by end of work day. Tried meloxicam, norco, flexeril, tramadol, and currently on prednisone dose pack. Yesterday had numbness in left foot but not now. No skin changes.   Past Medical History:  Diagnosis Date  . Asthma   . Gestational diabetes    glyburide  . Headache(784.0)   . Hepatitis B   . Hypertension   . Migraine     Current Outpatient Medications on File Prior to Visit  Medication Sig Dispense Refill  . cetirizine (ZYRTEC ALLERGY) 10 MG tablet Take 1 tablet (10 mg total) by mouth daily. (Patient not taking: Reported on 12/01/2016) 30 tablet 1  . cyclobenzaprine (FLEXERIL) 10 MG tablet Take 1 tablet (10 mg total) 2 (two) times daily as needed by mouth for muscle spasms. 20 tablet 0  . HYDROcodone-acetaminophen (NORCO/VICODIN) 5-325 MG tablet Take 1 tablet every 6 (six) hours as needed by mouth. (Patient taking differently: Take 1 tablet every 6 (six) hours as needed by mouth for moderate pain or severe pain. ) 8 tablet 0  . ipratropium (ATROVENT) 0.06 % nasal spray Place 2 sprays into both nostrils 4 (four) times daily. (Patient not taking: Reported on 12/01/2016) 15 mL 1  . meloxicam (MOBIC) 15 MG tablet Take 1 tablet (15 mg total) daily by mouth. 14 tablet 0  . predniSONE (DELTASONE) 50 MG tablet Take 1 tablet (50 mg total) daily by mouth. 5 tablet 0   No current facility-administered medications on file prior to visit.     Past Surgical History:  Procedure Laterality Date  . NO PAST SURGERIES      No Known Allergies  Social History   Socioeconomic History  . Marital status: Single    Spouse name: Not on file  . Number of children: Not on file   . Years of education: Not on file  . Highest education level: Not on file  Social Needs  . Financial resource strain: Not on file  . Food insecurity - worry: Not on file  . Food insecurity - inability: Not on file  . Transportation needs - medical: Not on file  . Transportation needs - non-medical: Not on file  Occupational History  . Not on file  Tobacco Use  . Smoking status: Never Smoker  . Smokeless tobacco: Never Used  Substance and Sexual Activity  . Alcohol use: No  . Drug use: No  . Sexual activity: Yes    Birth control/protection: None  Other Topics Concern  . Not on file  Social History Narrative  . Not on file    Family History  Problem Relation Age of Onset  . Diabetes Father   . Hypertension Father   . Stroke Father   . Asthma Brother   . Asthma Son   . Hearing loss Maternal Aunt   . Alcohol abuse Neg Hx   . Arthritis Neg Hx   . Birth defects Neg Hx   . Cancer Neg Hx   . COPD Neg Hx   . Depression Neg Hx   . Drug abuse Neg Hx   . Early death Neg Hx   . Heart disease Neg Hx   . Hyperlipidemia Neg Hx   .  Kidney disease Neg Hx   . Learning disabilities Neg Hx   . Mental illness Neg Hx   . Mental retardation Neg Hx   . Miscarriages / Stillbirths Neg Hx   . Vision loss Neg Hx     BP 119/76   Pulse 79   Ht 5\' 6"  (1.676 m)   Wt 216 lb (98 kg)   LMP 11/21/2016 Comment: waiver signed  BMI 34.86 kg/m   Review of Systems: See HPI above.     Objective:  Physical Exam:  Gen: NAD, comfortable in exam room  Left knee: No gross deformity, ecchymoses, swelling.  Fullness posteromedially. TTP popliteal fossa, proximal calf.  Mild medial joint line tenderness.  No other tenderness. ROM 0-100 degrees, pain with full extension and flexion.  5/5 strength. Negative ant/post drawers. Negative valgus/varus testing. Negative lachmanns. Negative mcmurrays, apleys, patellar apprehension. NV intact distally.  Right knee: No gross deformity, ecchymoses,  swelling. No TTP. FROM with full strength. Negative ant/post drawers. Negative valgus/varus testing. Negative lachmanns. NV intact distally.   MSK u/s:  Minimal effusion but bakers cyst confirmed posteromedially.    Assessment & Plan:  1. Left knee/leg pain - Independently reviewed radiographs and noted arthritic changes.  Baker's cyst seen on ultrasound but ordered doppler given severity of her pain and not responding to conservative treatment to date.  Doppler negative for DVT.  Intraarticular injection given (suspect arthritis caused cyst based on otherwise reassuring exam).  Advised if still not improving over next 1-2 weeks would consider aspiration/injection of cyst directly with ultrasound guidance.  Finish prednisone.  Tramadol, flexeril only if needed.    After informed written consent timeout was performed, patient was seated on exam table. Left knee was prepped with alcohol swab and utilizing anteromedial approach, patient's left knee was injected intraarticularly with 3:1 bupivicaine: depomedrol. Patient tolerated the procedure well without immediate complications.

## 2016-12-25 ENCOUNTER — Ambulatory Visit: Payer: BLUE CROSS/BLUE SHIELD | Admitting: Family Medicine

## 2016-12-25 ENCOUNTER — Encounter: Payer: Self-pay | Admitting: Family Medicine

## 2016-12-25 DIAGNOSIS — M25562 Pain in left knee: Secondary | ICD-10-CM

## 2016-12-25 MED ORDER — METHYLPREDNISOLONE ACETATE 40 MG/ML IJ SUSP
40.0000 mg | Freq: Once | INTRAMUSCULAR | Status: AC
  Administered 2016-12-25: 40 mg via INTRA_ARTICULAR

## 2016-12-25 NOTE — Patient Instructions (Addendum)
We aspirated and injected your baker's cyst today. Ice the area 15 minutes at a time 3-4 times a day. Ibuprofen and tylenol as needed for pain. If still not improving over the next 1+ weeks give me a call and we will arrange for imaging (x-ray, MRI). Follow up with me in 6 weeks or as needed if you're doing well.

## 2016-12-26 ENCOUNTER — Encounter: Payer: Self-pay | Admitting: Family Medicine

## 2016-12-26 NOTE — Assessment & Plan Note (Signed)
Radiographs with arthritis changes.  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  Only some transient benefit with cortisone injection into knee.  We discussed options - she would like to do u/s guided aspiration and injection of cyst directly which was performed today.  Icing, ibuprofen and tylenol if needed.  Consider imaging if not improving still.  F/u in 6 weeks.  After informed written consent timeout was performed.  Patient was lying prostrate on exam table.  Medial aspect of popliteal fossa prepped with alcohol swab.  Ultrasound guidance used for entire procedure.  3mL bupivicaine used for local anesthesia.  Then using an 18g needle on a 20mL syringe, patient's left knee baker's cyst was aspirated obtaining 9mL of clear yellow fluid followed by injection of 3:1 bupivicaine: depomedrol.  Patient tolerated procedure well without immediate complications.

## 2016-12-26 NOTE — Progress Notes (Signed)
PCP: System, Pcp Not In  Subjective:   HPI: Patient is a 46 y.o. female here for left leg pain.  11/21: Patient reports she's had about 1 week of worsening left knee/leg pain. Started anterior to posterior. Feels the pain go up to buttock at times. Associated cramping. Pain level 9/10 and sharp, difficulty walking. Worse by end of work day. Tried meloxicam, norco, flexeril, tramadol, and currently on prednisone dose pack. Yesterday had numbness in left foot but not now. No skin changes.  12/13: Patient reports she had improved until on Sunday when pain came back and she had trouble bearing weight on left leg because of knee pain. Pain now 10/10 and sharp in back of knee mainly. Worse with walking, standing, trying to extend knee. Using heating pad, tried ibuprofen and meloxicam with minimal benefits. No skin changes, numbness.  Past Medical History:  Diagnosis Date  . Asthma   . Gestational diabetes    glyburide  . Headache(784.0)   . Hepatitis B   . Hypertension   . Migraine     Current Outpatient Medications on File Prior to Visit  Medication Sig Dispense Refill  . cetirizine (ZYRTEC ALLERGY) 10 MG tablet Take 1 tablet (10 mg total) by mouth daily. (Patient not taking: Reported on 12/01/2016) 30 tablet 1  . cyclobenzaprine (FLEXERIL) 10 MG tablet Take 1 tablet (10 mg total) 2 (two) times daily as needed by mouth for muscle spasms. 20 tablet 0  . HYDROcodone-acetaminophen (NORCO/VICODIN) 5-325 MG tablet Take 1 tablet every 6 (six) hours as needed by mouth. (Patient taking differently: Take 1 tablet every 6 (six) hours as needed by mouth for moderate pain or severe pain. ) 8 tablet 0  . ipratropium (ATROVENT) 0.06 % nasal spray Place 2 sprays into both nostrils 4 (four) times daily. (Patient not taking: Reported on 12/01/2016) 15 mL 1  . meloxicam (MOBIC) 15 MG tablet Take 1 tablet (15 mg total) daily by mouth. 14 tablet 0  . predniSONE (DELTASONE) 50 MG tablet Take 1  tablet (50 mg total) daily by mouth. 5 tablet 0   No current facility-administered medications on file prior to visit.     Past Surgical History:  Procedure Laterality Date  . NO PAST SURGERIES      No Known Allergies  Social History   Socioeconomic History  . Marital status: Single    Spouse name: Not on file  . Number of children: Not on file  . Years of education: Not on file  . Highest education level: Not on file  Social Needs  . Financial resource strain: Not on file  . Food insecurity - worry: Not on file  . Food insecurity - inability: Not on file  . Transportation needs - medical: Not on file  . Transportation needs - non-medical: Not on file  Occupational History  . Not on file  Tobacco Use  . Smoking status: Never Smoker  . Smokeless tobacco: Never Used  Substance and Sexual Activity  . Alcohol use: No  . Drug use: No  . Sexual activity: Yes    Birth control/protection: None  Other Topics Concern  . Not on file  Social History Narrative  . Not on file    Family History  Problem Relation Age of Onset  . Diabetes Father   . Hypertension Father   . Stroke Father   . Asthma Brother   . Asthma Son   . Hearing loss Maternal Aunt   . Alcohol abuse Neg Hx   .  Arthritis Neg Hx   . Birth defects Neg Hx   . Cancer Neg Hx   . COPD Neg Hx   . Depression Neg Hx   . Drug abuse Neg Hx   . Early death Neg Hx   . Heart disease Neg Hx   . Hyperlipidemia Neg Hx   . Kidney disease Neg Hx   . Learning disabilities Neg Hx   . Mental illness Neg Hx   . Mental retardation Neg Hx   . Miscarriages / Stillbirths Neg Hx   . Vision loss Neg Hx     BP 129/85   Pulse 76   Ht 5\' 6"  (1.676 m)   Wt 210 lb (95.3 kg)   BMI 33.89 kg/m   Review of Systems: See HPI above.     Objective:  Physical Exam:  Gen: NAD, comfortable in exam room.  Left knee: No gross deformity, ecchymoses, swelling.  Fullness medially in popliteal fossa. TTP medial gastroc and medial  popliteal fossa.  Less tenderness medial joint line.  No other tenderness. ROM 0 - 100 degrees, pain with full extension.  5/5 strength. Negative ant/post drawers. Negative valgus/varus testing. Negative lachmanns. Negative mcmurrays, apleys. NV intact distally.   MSK u/s:  Minimal effusion and bakers cyst again confirmed.    Assessment & Plan:  1. Left knee/leg pain - Radiographs with arthritis changes.  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  Only some transient benefit with cortisone injection into knee.  We discussed options - she would like to do u/s guided aspiration and injection of cyst directly which was performed today.  Icing, ibuprofen and tylenol if needed.  Consider imaging if not improving still.  F/u in 6 weeks.  After informed written consent timeout was performed.  Patient was lying prostrate on exam table.  Medial aspect of popliteal fossa prepped with alcohol swab.  Ultrasound guidance used for entire procedure.  3mL bupivicaine used for local anesthesia.  Then using an 18g needle on a 20mL syringe, patient's left knee baker's cyst was aspirated obtaining 9mL of clear yellow fluid followed by injection of 3:1 bupivicaine: depomedrol.  Patient tolerated procedure well without immediate complications.

## 2017-01-17 ENCOUNTER — Other Ambulatory Visit: Payer: Self-pay

## 2017-01-17 ENCOUNTER — Emergency Department (HOSPITAL_BASED_OUTPATIENT_CLINIC_OR_DEPARTMENT_OTHER)
Admission: EM | Admit: 2017-01-17 | Discharge: 2017-01-18 | Disposition: A | Discharge status: Home / Self Care | Payer: BLUE CROSS/BLUE SHIELD | Attending: Emergency Medicine | Admitting: Emergency Medicine

## 2017-01-17 ENCOUNTER — Encounter (HOSPITAL_BASED_OUTPATIENT_CLINIC_OR_DEPARTMENT_OTHER): Payer: Self-pay | Admitting: Emergency Medicine

## 2017-01-17 DIAGNOSIS — Z79899 Other long term (current) drug therapy: Secondary | ICD-10-CM | POA: Insufficient documentation

## 2017-01-17 DIAGNOSIS — E876 Hypokalemia: Secondary | ICD-10-CM | POA: Diagnosis not present

## 2017-01-17 DIAGNOSIS — R112 Nausea with vomiting, unspecified: Secondary | ICD-10-CM | POA: Diagnosis not present

## 2017-01-17 DIAGNOSIS — R51 Headache: Secondary | ICD-10-CM | POA: Diagnosis present

## 2017-01-17 DIAGNOSIS — E119 Type 2 diabetes mellitus without complications: Secondary | ICD-10-CM | POA: Insufficient documentation

## 2017-01-17 DIAGNOSIS — R5383 Other fatigue: Secondary | ICD-10-CM | POA: Diagnosis not present

## 2017-01-17 DIAGNOSIS — I10 Essential (primary) hypertension: Secondary | ICD-10-CM | POA: Diagnosis not present

## 2017-01-17 DIAGNOSIS — J45909 Unspecified asthma, uncomplicated: Secondary | ICD-10-CM | POA: Insufficient documentation

## 2017-01-17 DIAGNOSIS — G44209 Tension-type headache, unspecified, not intractable: Secondary | ICD-10-CM | POA: Insufficient documentation

## 2017-01-17 LAB — CBC WITH DIFFERENTIAL/PLATELET
Basophils Absolute: 0.1 10*3/uL (ref 0.0–0.1)
Basophils Relative: 1 %
Eosinophils Absolute: 0.1 10*3/uL (ref 0.0–0.7)
Eosinophils Relative: 2 %
HCT: 29.2 % — ABNORMAL LOW (ref 36.0–46.0)
Hemoglobin: 9 g/dL — ABNORMAL LOW (ref 12.0–15.0)
Lymphocytes Relative: 35 %
Lymphs Abs: 2.9 10*3/uL (ref 0.7–4.0)
MCH: 25.1 pg — ABNORMAL LOW (ref 26.0–34.0)
MCHC: 30.8 g/dL (ref 30.0–36.0)
MCV: 81.3 fL (ref 78.0–100.0)
Monocytes Absolute: 0.7 10*3/uL (ref 0.1–1.0)
Monocytes Relative: 8 %
Neutro Abs: 4.6 10*3/uL (ref 1.7–7.7)
Neutrophils Relative %: 54 %
Platelets: 352 10*3/uL (ref 150–400)
RBC: 3.59 MIL/uL — ABNORMAL LOW (ref 3.87–5.11)
RDW: 15.4 % (ref 11.5–15.5)
WBC: 8.2 10*3/uL (ref 4.0–10.5)

## 2017-01-17 LAB — COMPREHENSIVE METABOLIC PANEL
ALT: 18 U/L (ref 14–54)
AST: 20 U/L (ref 15–41)
Albumin: 3.2 g/dL — ABNORMAL LOW (ref 3.5–5.0)
Alkaline Phosphatase: 73 U/L (ref 38–126)
Anion gap: 6 (ref 5–15)
BUN: 11 mg/dL (ref 6–20)
CO2: 25 mmol/L (ref 22–32)
Calcium: 8.8 mg/dL — ABNORMAL LOW (ref 8.9–10.3)
Chloride: 105 mmol/L (ref 101–111)
Creatinine, Ser: 0.58 mg/dL (ref 0.44–1.00)
GFR calc Af Amer: >60 mL/min (ref >60)
GFR calc non Af Amer: >60 mL/min (ref >60)
Glucose, Bld: 126 mg/dL — ABNORMAL HIGH (ref 65–99)
Potassium: 3.1 mmol/L — ABNORMAL LOW (ref 3.5–5.1)
Sodium: 136 mmol/L (ref 135–145)
Total Bilirubin: 0.4 mg/dL (ref 0.3–1.2)
Total Protein: 7.5 g/dL (ref 6.5–8.1)

## 2017-01-17 LAB — URINALYSIS, ROUTINE W REFLEX MICROSCOPIC
BILIRUBIN URINE: NEGATIVE
Glucose, UA: NEGATIVE mg/dL
HGB URINE DIPSTICK: NEGATIVE
Ketones, ur: NEGATIVE mg/dL
Leukocytes, UA: NEGATIVE
NITRITE: NEGATIVE
PROTEIN: NEGATIVE mg/dL
Specific Gravity, Urine: >1.03 — ABNORMAL HIGH (ref 1.005–1.030)
pH: 5.5 (ref 5.0–8.0)

## 2017-01-17 LAB — PREGNANCY, URINE: Preg Test, Ur: NEGATIVE

## 2017-01-17 MED ORDER — POTASSIUM CHLORIDE CRYS ER 20 MEQ PO TBCR
60.0000 meq | EXTENDED_RELEASE_TABLET | Freq: Once | ORAL | Status: AC
  Administered 2017-01-17: 60 meq via ORAL
  Filled 2017-01-17: qty 3

## 2017-01-17 MED ORDER — SODIUM CHLORIDE 0.9 % IV BOLUS (SEPSIS)
1000.0000 mL | Freq: Once | INTRAVENOUS | Status: AC
  Administered 2017-01-17: 1000 mL via INTRAVENOUS

## 2017-01-17 MED ORDER — METOCLOPRAMIDE HCL 5 MG/ML IJ SOLN
10.0000 mg | Freq: Once | INTRAMUSCULAR | Status: AC
  Administered 2017-01-17: 10 mg via INTRAVENOUS
  Filled 2017-01-17: qty 2

## 2017-01-17 MED ORDER — KETOROLAC TROMETHAMINE 30 MG/ML IJ SOLN
30.0000 mg | Freq: Once | INTRAMUSCULAR | Status: AC
  Administered 2017-01-17: 30 mg via INTRAVENOUS
  Filled 2017-01-17: qty 1

## 2017-01-17 NOTE — ED Triage Notes (Signed)
Patient states she was in the ER recently for throat swelling. THe patient states that since she was given the prednisone she has had "heat coming out of my body" and dizzy and headache  - just not feeling her self - no feeling to eat, weakness and multiple other complaints

## 2017-01-18 MED ORDER — METHOCARBAMOL 500 MG PO TABS: 500.0000 mg | ORAL_TABLET | Freq: Two times a day (BID) | ORAL | 0 refills | Status: DC

## 2017-01-18 MED ORDER — ONDANSETRON HCL 4 MG PO TABS: 4.0000 mg | ORAL_TABLET | Freq: Four times a day (QID) | ORAL | 0 refills | Status: DC

## 2017-01-18 NOTE — ED Provider Notes (Signed)
[facility] EMERGENCY DEPARTMENT Provider Note   CSN: [id number] Arrival date & time: 01/17/17  1805     History   Chief Complaint Chief Complaint  Patient presents with  . Headache    HPI Mary Richardson is a 47 y.o. female with history of hypertension, migraine headaches, asthma, and anemia who presents with a 4-day history of, headache, generalized fatigue, nausea and intermittent vomiting.  Patient reports she has had a persistent headache that is present every morning when she is waking up.  It is gradual onset.  She describes as a band across her forehead and down to the left side of her neck.  She reports having headaches like this in the past, however they never usually last 4 days.  She has taken Tylenol at home without relief.  She has been able to tolerate fluids, however has had a few episodes of intermittent vomiting.  She has had associated photophobia.  She denies any vision changes otherwise.  She also denies any fevers, chest pain, shortness of breath, abdominal pain, diarrhea.  Patient was recently treated for strep on 01/09/2017, however those symptoms have resolved.  She is almost finished her amoxicillin prescription.  HPI  Past Medical History:  Diagnosis Date  . Asthma   . Gestational diabetes    glyburide  . Headache(784.0)   . Hepatitis B   . Hypertension   . Migraine     Patient Active Problem List   Diagnosis Date Noted  . Left knee pain 12/06/2016  . Vitamin D deficiency 03/13/2016  . Normochromic normocytic anemia 03/13/2016  . Benign essential hypertension 03/13/2016  . Type 2 diabetes mellitus without complication, without long-term current use of insulin (HCC) 08/28/2015  . Hyperlipidemia 08/28/2015  . Non morbid obesity due to excess calories 02/19/2015  . Gestational diabetes mellitus in childbirth 01/11/2013    Past Surgical History:  Procedure Laterality Date  . NO PAST SURGERIES      OB History    Gravida Para Term Preterm AB  Living   2 2 1 1   2    SAB TAB Ectopic Multiple Live Births           2       Home Medications    Prior to Admission medications   Medication Sig Start Date End Date Taking? Authorizing Provider  cetirizine (ZYRTEC ALLERGY) 10 MG tablet Take 1 tablet (10 mg total) by mouth daily. Patient not taking: Reported on 12/01/2016 03/11/15   Everlene Farrier, PA-C  cyclobenzaprine (FLEXERIL) 10 MG tablet Take 1 tablet (10 mg total) 2 (two) times daily as needed by mouth for muscle spasms. 12/02/16   Linwood Dibbles, MD  HYDROcodone-acetaminophen (NORCO/VICODIN) 5-325 MG tablet Take 1 tablet every 6 (six) hours as needed by mouth. Patient taking differently: Take 1 tablet every 6 (six) hours as needed by mouth for moderate pain or severe pain.  11/28/16   Charlynne Pander, MD  ipratropium (ATROVENT) 0.06 % nasal spray Place 2 sprays into both nostrils 4 (four) times daily. Patient not taking: Reported on 12/01/2016 01/04/15   Linna Hoff, MD  meloxicam (MOBIC) 15 MG tablet Take 1 tablet (15 mg total) daily by mouth. 12/02/16   Linwood Dibbles, MD  methocarbamol (ROBAXIN) 500 MG tablet Take 1 tablet (500 mg total) by mouth 2 (two) times daily. 01/18/17   Adraine Biffle, Waylan Boga, PA-C  ondansetron (ZOFRAN) 4 MG tablet Take 1 tablet (4 mg total) by mouth every 6 (six) hours. 01/18/17  Silvina Hackleman M, PA-C  predniSONE (DELTASONE) 50 MG tablet Take 1 tablet (50 mg total) daily by mouth. 12/02/16   Linwood DibblesKnapp, Jon, MD    Family History Family History  Problem Relation Age of Onset  . Diabetes Father   . Hypertension Father   . Stroke Father   . Asthma Brother   . Asthma Son   . Hearing loss Maternal Aunt   . Alcohol abuse Neg Hx   . Arthritis Neg Hx   . Birth defects Neg Hx   . Cancer Neg Hx   . COPD Neg Hx   . Depression Neg Hx   . Drug abuse Neg Hx   . Early death Neg Hx   . Heart disease Neg Hx   . Hyperlipidemia Neg Hx   . Kidney disease Neg Hx   . Learning disabilities Neg Hx   . Mental illness Neg Hx    . Mental retardation Neg Hx   . Miscarriages / Stillbirths Neg Hx   . Vision loss Neg Hx     Social History Social History   Tobacco Use  . Smoking status: Never Smoker  . Smokeless tobacco: Never Used  Substance Use Topics  . Alcohol use: No  . Drug use: No     Allergies   Patient has no known allergies.   Review of Systems Review of Systems  Constitutional: Positive for fatigue. Negative for chills and fever.  HENT: Negative for facial swelling and sore throat.   Respiratory: Negative for shortness of breath.   Cardiovascular: Negative for chest pain.  Gastrointestinal: Positive for nausea and vomiting. Negative for abdominal pain.  Genitourinary: Negative for dysuria.  Musculoskeletal: Negative for back pain.  Skin: Negative for rash and wound.  Neurological: Positive for headaches.  Psychiatric/Behavioral: The patient is not nervous/anxious.      Physical Exam Updated Vital Signs BP 125/73   Pulse 87   Temp 98.2 F (36.8 C) (Oral)   Resp 20   Ht 5\' 5"  (1.651 m)   Wt 98 kg (216 lb)   LMP 01/17/2017   SpO2 100%   BMI 35.94 kg/m   Physical Exam  Constitutional: She appears well-developed and well-nourished. No distress.  HENT:  Head: Normocephalic and atraumatic.  Mouth/Throat: Oropharynx is clear and moist. No oropharyngeal exudate.  Eyes: Conjunctivae and EOM are normal. Pupils are equal, round, and reactive to light. Right eye exhibits no discharge. Left eye exhibits no discharge. No scleral icterus.  Neck: Normal range of motion. Neck supple. No thyromegaly present.    Cardiovascular: Normal rate, regular rhythm, normal heart sounds and intact distal pulses. Exam reveals no gallop and no friction rub.  No murmur heard. Pulmonary/Chest: Effort normal and breath sounds normal. No stridor. No respiratory distress. She has no wheezes. She has no rales.  Abdominal: Soft. Bowel sounds are normal. She exhibits no distension. There is no tenderness. There  is no rebound and no guarding.  Musculoskeletal: She exhibits no edema.  Lymphadenopathy:    She has no cervical adenopathy.  Neurological: She is alert. Coordination normal.  CN 3-12 intact; normal sensation throughout; 5/5 strength in all 4 extremities; equal bilateral grip strength  Skin: Skin is warm and dry. No rash noted. She is not diaphoretic. No pallor.  Psychiatric: She has a normal mood and affect.  Nursing note and vitals reviewed.    ED Treatments / Results  Labs (all labs ordered are listed, but only abnormal results are displayed) Labs Reviewed  URINALYSIS,  ROUTINE W REFLEX MICROSCOPIC - Abnormal; Notable for the following components:      Result Value   Specific Gravity, Urine >1.030 (*)    All other components within normal limits  CBC WITH DIFFERENTIAL/PLATELET - Abnormal; Notable for the following components:   RBC 3.59 (*)    Hemoglobin 9.0 (*)    HCT 29.2 (*)    MCH 25.1 (*)    All other components within normal limits  COMPREHENSIVE METABOLIC PANEL - Abnormal; Notable for the following components:   Potassium 3.1 (*)    Glucose, Bld 126 (*)    Calcium 8.8 (*)    Albumin 3.2 (*)    All other components within normal limits  PREGNANCY, URINE    EKG  EKG Interpretation None       Radiology No results found.  Procedures Procedures (including critical care time)  Medications Ordered in ED Medications  sodium chloride 0.9 % bolus 1,000 mL (0 mLs Intravenous Stopped 01/17/17 2338)  ketorolac (TORADOL) 30 MG/ML injection 30 mg (30 mg Intravenous Given 01/17/17 2252)  metoCLOPramide (REGLAN) injection 10 mg (10 mg Intravenous Given 01/17/17 2251)  potassium chloride SA (K-DUR,KLOR-CON) CR tablet 60 mEq (60 mEq Oral Given 01/17/17 2336)     Initial Impression / Assessment and Plan / ED Course  I have reviewed the triage vital signs and the nursing notes.  Pertinent labs & imaging results that were available during my care of the patient were reviewed by  me and considered in my medical decision making (see chart for details).     Patient presenting with symptoms consistent with tension headache.  Patient feeling much better after fluids, Toradol, Compazine, Benadryl.  Normal neuro exam without focal deficits.  Patient with stable chronic anemia, hemoglobin 9.0.  Patient reports she has not been taking her iron at home.  I recommended she resume taking that.  Mild hypokalemia, 3.1, replaced in the ED.  Urine pregnancy and UA is negative.  Patient is tolerating PO fluids and is feeling much better.  Will discharge home with Zofran and Robaxin to help with tension in left-sided neck.  I also discussed neck stretches and heat.  Return precautions discussed.  Follow-up with PCP if symptoms are not improving.  Patient vitals stable throughout ED course and discharged in satisfactory condition.  Final Clinical Impressions(s) / ED Diagnoses   Final diagnoses:  Other fatigue  Non-intractable vomiting with nausea, unspecified vomiting type  Tension headache    ED Discharge Orders        Ordered    ondansetron (ZOFRAN) 4 MG tablet  Every 6 hours     01/18/17 0030    methocarbamol (ROBAXIN) 500 MG tablet  2 times daily     01/18/17 0031       Emi Holes, PA-C 01/18/17 0109    Tilden Fossa, MD 01/18/17 1442

## 2017-01-18 NOTE — Discharge Instructions (Signed)
Take Robaxin twice daily as needed for muscle pain or spasms.  Do not drive or operate machinery while taking this medication.  Take Zofran every 6 hours as needed for nausea or vomiting.  Resume taking your iron supplements for your anemia.  You can take ibuprofen and/or Tylenol as prescribed over-the-counter, as needed for your headaches.  Try to stretch her neck as we discussed daily.  Please follow-up with your doctor if your symptoms are not improving.  Please return to emergency department if you develop any new or worsening symptoms.

## 2017-02-05 ENCOUNTER — Ambulatory Visit: Payer: BLUE CROSS/BLUE SHIELD | Admitting: Family Medicine

## 2017-02-05 ENCOUNTER — Telehealth: Payer: Self-pay | Admitting: Family Medicine

## 2017-02-05 ENCOUNTER — Encounter: Payer: Self-pay | Admitting: Family Medicine

## 2017-02-05 DIAGNOSIS — M6530 Trigger finger, unspecified finger: Secondary | ICD-10-CM

## 2017-02-05 DIAGNOSIS — M25562 Pain in left knee: Secondary | ICD-10-CM | POA: Diagnosis not present

## 2017-02-05 MED ORDER — IBUPROFEN 600 MG PO TABS: 600.0000 mg | ORAL_TABLET | Freq: Four times a day (QID) | ORAL | 1 refills | Status: DC | PRN

## 2017-02-05 NOTE — Telephone Encounter (Signed)
I can send in higher dosage of ibuprofen (600mg ).  [facility] [address]

## 2017-02-05 NOTE — Telephone Encounter (Signed)
Sent in for her.  Thanks!

## 2017-02-05 NOTE — Telephone Encounter (Signed)
Patient informed. 

## 2017-02-05 NOTE — Telephone Encounter (Signed)
Patient would like Rx sent to [facility] [address]

## 2017-02-05 NOTE — Patient Instructions (Signed)
We will go ahead with an MRI of your left knee since you're continuing to have pain despite injections, conservative treatment to date.  You have a trigger finger of your thumb. Wear the splint at all times if possible. Ibuprofen or aleve if needed for pain and inflammation. Consider injection if not improving over 6-10 weeks. Follow up with me in 6 weeks for this issue but we will talk about your MRI results before this.

## 2017-02-05 NOTE — Telephone Encounter (Signed)
Patient requesting Rx for Ibuprofen. States she dislikes taking pills and would prefer a higher dosage in order to take 1 pill versus 3 pills

## 2017-02-06 ENCOUNTER — Encounter: Payer: Self-pay | Admitting: Family Medicine

## 2017-02-06 DIAGNOSIS — M6530 Trigger finger, unspecified finger: Secondary | ICD-10-CM | POA: Insufficient documentation

## 2017-02-06 NOTE — Addendum Note (Signed)
Addended by: Kathi SimpersWISE, Aleeta Schmaltz F on: 02/06/2017 12:24 PM   Modules accepted: Orders

## 2017-02-06 NOTE — Progress Notes (Addendum)
PCP: System, Pcp Not In  Subjective:   HPI: Patient is a 47 y.o. female here for left leg pain.  11/21: Patient reports she's had about 1 week of worsening left knee/leg pain. Started anterior to posterior. Feels the pain go up to buttock at times. Associated cramping. Pain level 9/10 and sharp, difficulty walking. Worse by end of work day. Tried meloxicam, norco, flexeril, tramadol, and currently on prednisone dose pack. Yesterday had numbness in left foot but not now. No skin changes.  12/13: Patient reports she had improved until on Sunday when pain came back and she had trouble bearing weight on left leg because of knee pain. Pain now 10/10 and sharp in back of knee mainly. Worse with walking, standing, trying to extend knee. Using heating pad, tried ibuprofen and meloxicam with minimal benefits. No skin changes, numbness.  1/24: Patient reports her knee has improved some but still with 7/10 level of pain anteriorly, medially. Pain worse with cold weather. Also reports back in November she had a box fall on her right thumb. Initially did ok then worsened again with locking of the thumb noted in the morning especially. Had x-rays at an urgent care that were negative. Pain up to 10/10, sharp. She is right handed.  Past Medical History:  Diagnosis Date  . Asthma   . Gestational diabetes    glyburide  . Headache(784.0)   . Hepatitis B   . Hypertension   . Migraine     Current Outpatient Medications on File Prior to Visit  Medication Sig Dispense Refill  . cetirizine (ZYRTEC ALLERGY) 10 MG tablet Take 1 tablet (10 mg total) by mouth daily. (Patient not taking: Reported on 12/01/2016) 30 tablet 1  . cyclobenzaprine (FLEXERIL) 10 MG tablet Take 1 tablet (10 mg total) 2 (two) times daily as needed by mouth for muscle spasms. 20 tablet 0  . HYDROcodone-acetaminophen (NORCO/VICODIN) 5-325 MG tablet Take 1 tablet every 6 (six) hours as needed by mouth. (Patient taking  differently: Take 1 tablet every 6 (six) hours as needed by mouth for moderate pain or severe pain. ) 8 tablet 0  . ipratropium (ATROVENT) 0.06 % nasal spray Place 2 sprays into both nostrils 4 (four) times daily. (Patient not taking: Reported on 12/01/2016) 15 mL 1  . methocarbamol (ROBAXIN) 500 MG tablet Take 1 tablet (500 mg total) by mouth 2 (two) times daily. 20 tablet 0  . ondansetron (ZOFRAN) 4 MG tablet Take 1 tablet (4 mg total) by mouth every 6 (six) hours. 12 tablet 0  . predniSONE (DELTASONE) 50 MG tablet Take 1 tablet (50 mg total) daily by mouth. 5 tablet 0   No current facility-administered medications on file prior to visit.     Past Surgical History:  Procedure Laterality Date  . NO PAST SURGERIES      No Known Allergies  Social History   Socioeconomic History  . Marital status: Single    Spouse name: Not on file  . Number of children: Not on file  . Years of education: Not on file  . Highest education level: Not on file  Social Needs  . Financial resource strain: Not on file  . Food insecurity - worry: Not on file  . Food insecurity - inability: Not on file  . Transportation needs - medical: Not on file  . Transportation needs - non-medical: Not on file  Occupational History  . Not on file  Tobacco Use  . Smoking status: Never Smoker  . Smokeless  tobacco: Never Used  Substance and Sexual Activity  . Alcohol use: No  . Drug use: No  . Sexual activity: Yes    Birth control/protection: None  Other Topics Concern  . Not on file  Social History Narrative  . Not on file    Family History  Problem Relation Age of Onset  . Diabetes Father   . Hypertension Father   . Stroke Father   . Asthma Brother   . Asthma Son   . Hearing loss Maternal Aunt   . Alcohol abuse Neg Hx   . Arthritis Neg Hx   . Birth defects Neg Hx   . Cancer Neg Hx   . COPD Neg Hx   . Depression Neg Hx   . Drug abuse Neg Hx   . Early death Neg Hx   . Heart disease Neg Hx   .  Hyperlipidemia Neg Hx   . Kidney disease Neg Hx   . Learning disabilities Neg Hx   . Mental illness Neg Hx   . Mental retardation Neg Hx   . Miscarriages / Stillbirths Neg Hx   . Vision loss Neg Hx     BP 107/74   Pulse 77   Ht 5\' 6"  (1.676 m)   Wt 218 lb (98.9 kg)   LMP 01/17/2017   BMI 35.19 kg/m   Review of Systems: See HPI above.     Objective:  Physical Exam:  Gen: NAD, comfortable in exam room.  Left knee: No gross deformity, ecchymoses, effusion.  No fullness felt in popliteal fossa. TTP medial joint line.  No other tenderness including posterior knee, medial gastroc. FROM with 5/5 strength. Negative ant/post drawers. Negative valgus/varus testing. Negative lachmanns. Negative mcmurrays, apleys, patellar apprehension. NV intact distally.  Right hand: No deformity, swelling, bruising, malrotation or angulation of digits.   Tender nodule at A1 pulley of 1st digit. FROM with catching on flexion of 1st IP joint. Collateral ligaments intact. Strength 5/5 flexion and extension 1st digit IP and MCP joints with mild pain on flexion. NVI distally.  Assessment & Plan:  1. Left knee/leg pain - She has mild arthritis with baker's cyst.  She has not responded as well as expected with knee injection and cyst aspiration/injection.  If arthritis were cause of her pain would expect significantly more improvement than she's had to date.  Will go ahead with MRI to further assess.  Icing, ibuprofen or aleve if needed.  2. Right trigger thumb - Splint provided to keep IP joint in extension for 6-10 weeks.  Ibuprofen or aleve.  Consider injection if not improving.  F/u in 6 weeks.  Addendum: MRI reviewed and discussed with patient.  She does have a complex tear and extrusion of medial meniscus.  There are areas of Grade 3 and Grade 4 articular cartilage loss unfortunately as well.  She reports pain is still severe despite injections, conservative treatment.  Will refer to ortho to  discuss arthroscopy.

## 2017-02-06 NOTE — Assessment & Plan Note (Signed)
Right trigger thumb - Splint provided to keep IP joint in extension for 6-10 weeks.  Ibuprofen or aleve.  Consider injection if not improving.  F/u in 6 weeks.

## 2017-02-06 NOTE — Assessment & Plan Note (Signed)
She has mild arthritis with baker's cyst.  She has not responded as well as expected with knee injection and cyst aspiration/injection.  If arthritis were cause of her pain would expect significantly more improvement than she's had to date.  Will go ahead with MRI to further assess.  Icing, ibuprofen or aleve if needed.

## 2017-02-19 ENCOUNTER — Encounter: Payer: Self-pay | Admitting: Family Medicine

## 2017-02-19 NOTE — Addendum Note (Signed)
Addended by: Kathi SimpersWISE, Dola Lunsford F on: 02/19/2017 02:02 PM   Modules accepted: Orders

## 2017-03-19 ENCOUNTER — Ambulatory Visit: Payer: BLUE CROSS/BLUE SHIELD | Admitting: Family Medicine

## 2017-03-24 ENCOUNTER — Ambulatory Visit: Payer: BLUE CROSS/BLUE SHIELD | Admitting: Family Medicine

## 2017-04-15 ENCOUNTER — Ambulatory Visit: Payer: BLUE CROSS/BLUE SHIELD | Admitting: Family Medicine

## 2017-04-16 ENCOUNTER — Ambulatory Visit: Payer: BLUE CROSS/BLUE SHIELD | Admitting: Family Medicine

## 2017-04-16 ENCOUNTER — Encounter: Payer: Self-pay | Admitting: Family Medicine

## 2017-04-16 DIAGNOSIS — G5603 Carpal tunnel syndrome, bilateral upper limbs: Secondary | ICD-10-CM

## 2017-04-16 DIAGNOSIS — M6530 Trigger finger, unspecified finger: Secondary | ICD-10-CM

## 2017-04-16 MED ORDER — METHYLPREDNISOLONE ACETATE 40 MG/ML IJ SUSP
20.0000 mg | Freq: Once | INTRAMUSCULAR | Status: AC
  Administered 2017-04-16: 20 mg via INTRA_ARTICULAR

## 2017-04-16 NOTE — Patient Instructions (Signed)
You have a trigger finger of your thumb. Ibuprofen or aleve if needed for pain and inflammation. You were given an injection for this today.  You have carpal tunnel syndrome. Wear the wrist braces at nighttime and as often as possible during the day Ibuprofen 600mg  three times a day with food OR aleve 2 tabs twice a day with food for pain and inflammation. A prednisone dose pack is a consideration. Corticosteroid injection is a consideration to help with pain and inflammation If not improving, will consider nerve conduction studies to assess severity. Follow up with me in 1 month for both issues.

## 2017-04-19 ENCOUNTER — Encounter: Payer: Self-pay | Admitting: Family Medicine

## 2017-04-19 DIAGNOSIS — G5603 Carpal tunnel syndrome, bilateral upper limbs: Secondary | ICD-10-CM | POA: Insufficient documentation

## 2017-04-19 NOTE — Progress Notes (Signed)
PCP: System, Pcp Not In  Subjective:   HPI: Patient is a 47 y.o. female here for right thumb pain.  11/21: Patient reports she's had about 1 week of worsening left knee/leg pain. Started anterior to posterior. Feels the pain go up to buttock at times. Associated cramping. Pain level 9/10 and sharp, difficulty walking. Worse by end of work day. Tried meloxicam, norco, flexeril, tramadol, and currently on prednisone dose pack. Yesterday had numbness in left foot but not now. No skin changes.  12/13: Patient reports she had improved until on Sunday when pain came back and she had trouble bearing weight on left leg because of knee pain. Pain now 10/10 and sharp in back of knee mainly. Worse with walking, standing, trying to extend knee. Using heating pad, tried ibuprofen and meloxicam with minimal benefits. No skin changes, numbness.  1/24: Patient reports her knee has improved some but still with 7/10 level of pain anteriorly, medially. Pain worse with cold weather. Also reports back in November she had a box fall on her right thumb. Initially did ok then worsened again with locking of the thumb noted in the morning especially. Had x-rays at an urgent care that were negative. Pain up to 10/10, sharp. She is right handed.  4/4: Patient reports her right thumb continues to bother her. Last week was especially bad. Pain up to 10/10 and sharp on radial side. Difficulty grabbing items, bending thumb. She is also getting tingling and numbness in both hands more on radial side into thumbs, 2nd, 3rd digits. No skin changes.  Past Medical History:  Diagnosis Date  . Asthma   . Gestational diabetes    glyburide  . Headache(784.0)   . Hepatitis B   . Hypertension   . Migraine     Current Outpatient Medications on File Prior to Visit  Medication Sig Dispense Refill  . cetirizine (ZYRTEC ALLERGY) 10 MG tablet Take 1 tablet (10 mg total) by mouth daily. (Patient not taking:  Reported on 12/01/2016) 30 tablet 1  . ipratropium (ATROVENT) 0.06 % nasal spray Place 2 sprays into both nostrils 4 (four) times daily. (Patient not taking: Reported on 12/01/2016) 15 mL 1  . meloxicam (MOBIC) 15 MG tablet      No current facility-administered medications on file prior to visit.     Past Surgical History:  Procedure Laterality Date  . NO PAST SURGERIES      No Known Allergies  Social History   Socioeconomic History  . Marital status: Single    Spouse name: Not on file  . Number of children: Not on file  . Years of education: Not on file  . Highest education level: Not on file  Occupational History  . Not on file  Social Needs  . Financial resource strain: Not on file  . Food insecurity:    Worry: Not on file    Inability: Not on file  . Transportation needs:    Medical: Not on file    Non-medical: Not on file  Tobacco Use  . Smoking status: Never Smoker  . Smokeless tobacco: Never Used  Substance and Sexual Activity  . Alcohol use: No  . Drug use: No  . Sexual activity: Yes    Birth control/protection: None  Lifestyle  . Physical activity:    Days per week: Not on file    Minutes per session: Not on file  . Stress: Not on file  Relationships  . Social connections:    Talks on  phone: Not on file    Gets together: Not on file    Attends religious service: Not on file    Active member of club or organization: Not on file    Attends meetings of clubs or organizations: Not on file    Relationship status: Not on file  . Intimate partner violence:    Fear of current or ex partner: Not on file    Emotionally abused: Not on file    Physically abused: Not on file    Forced sexual activity: Not on file  Other Topics Concern  . Not on file  Social History Narrative  . Not on file    Family History  Problem Relation Age of Onset  . Diabetes Father   . Hypertension Father   . Stroke Father   . Asthma Brother   . Asthma Son   . Hearing loss  Maternal Aunt   . Alcohol abuse Neg Hx   . Arthritis Neg Hx   . Birth defects Neg Hx   . Cancer Neg Hx   . COPD Neg Hx   . Depression Neg Hx   . Drug abuse Neg Hx   . Early death Neg Hx   . Heart disease Neg Hx   . Hyperlipidemia Neg Hx   . Kidney disease Neg Hx   . Learning disabilities Neg Hx   . Mental illness Neg Hx   . Mental retardation Neg Hx   . Miscarriages / Stillbirths Neg Hx   . Vision loss Neg Hx     BP 126/75   Pulse 68   Ht 5\' 6"  (1.676 m)   Wt 215 lb (97.5 kg)   BMI 34.70 kg/m   Review of Systems: See HPI above.     Objective:  Physical Exam:  Gen: NAD, comfortable in exam room.  Right hand: No deformity, swelling, bruising. Tender nodule at A1 pulley of 1st digit.   Difficulty bending at IP joint 1st digit due to pain.  5/5 strength flexion and extension Collateral ligaments intact of 1st digit. NVI distally currently. Negative tinels carpal tunnel.  Left hand: No deformity. FROM with 5/5 strength. No tenderness to palpation. NVI distally. Negative tinels carpal tunnel.  Assessment & Plan:  1. Right trigger thumb - not improved with splinting.  Injection given today.  Aleve or ibuprofen if needed.    After informed written consent timeout was performed and patient was seated in chair of exam room.  Area overlying right 1st digit A1 pulley prepped with alcohol swab then injected with 0.5:0.48mL bupivicaine: depomedrol.  Patient tolerated procedure well without immediate complications.  2. Carpal tunnel syndrome - patient describing symptoms of bilateral carpal tunnel syndrome.  Bilateral wrist braces at nighttime and as often during day as possible.  Ibuprofen or aleve.  Consider prednisone dose pack, injections, NCVs if not improving.  F/u in 1 month.

## 2017-04-19 NOTE — Assessment & Plan Note (Signed)
patient describing symptoms of bilateral carpal tunnel syndrome.  Bilateral wrist braces at nighttime and as often during day as possible.  Ibuprofen or aleve.  Consider prednisone dose pack, injections, NCVs if not improving.  F/u in 1 month.

## 2017-04-19 NOTE — Assessment & Plan Note (Signed)
Right trigger thumb - not improved with splinting.  Injection given today.  Aleve or ibuprofen if needed.    After informed written consent timeout was performed and patient was seated in chair of exam room.  Area overlying right 1st digit A1 pulley prepped with alcohol swab then injected with 0.5:0.545mL bupivicaine: depomedrol.  Patient tolerated procedure well without immediate complications.

## 2017-04-30 ENCOUNTER — Emergency Department (HOSPITAL_BASED_OUTPATIENT_CLINIC_OR_DEPARTMENT_OTHER)
Admission: EM | Admit: 2017-04-30 | Discharge: 2017-04-30 | Disposition: A | Discharge status: Home / Self Care | Payer: BLUE CROSS/BLUE SHIELD | Attending: Emergency Medicine | Admitting: Emergency Medicine

## 2017-04-30 ENCOUNTER — Encounter (HOSPITAL_BASED_OUTPATIENT_CLINIC_OR_DEPARTMENT_OTHER): Payer: Self-pay | Admitting: Emergency Medicine

## 2017-04-30 ENCOUNTER — Other Ambulatory Visit: Payer: Self-pay

## 2017-04-30 DIAGNOSIS — I10 Essential (primary) hypertension: Secondary | ICD-10-CM | POA: Diagnosis not present

## 2017-04-30 DIAGNOSIS — R509 Fever, unspecified: Secondary | ICD-10-CM | POA: Insufficient documentation

## 2017-04-30 DIAGNOSIS — E119 Type 2 diabetes mellitus without complications: Secondary | ICD-10-CM | POA: Insufficient documentation

## 2017-04-30 DIAGNOSIS — R05 Cough: Secondary | ICD-10-CM | POA: Insufficient documentation

## 2017-04-30 DIAGNOSIS — J0110 Acute frontal sinusitis, unspecified: Secondary | ICD-10-CM | POA: Insufficient documentation

## 2017-04-30 DIAGNOSIS — R6883 Chills (without fever): Secondary | ICD-10-CM | POA: Diagnosis not present

## 2017-04-30 DIAGNOSIS — J45909 Unspecified asthma, uncomplicated: Secondary | ICD-10-CM | POA: Diagnosis not present

## 2017-04-30 DIAGNOSIS — R0981 Nasal congestion: Secondary | ICD-10-CM | POA: Diagnosis not present

## 2017-04-30 DIAGNOSIS — R070 Pain in throat: Secondary | ICD-10-CM | POA: Diagnosis present

## 2017-04-30 MED ORDER — AMOXICILLIN-POT CLAVULANATE 875-125 MG PO TABS: 1.0000 | ORAL_TABLET | Freq: Two times a day (BID) | ORAL | 0 refills | Status: DC

## 2017-04-30 MED ORDER — BENZONATATE 100 MG PO CAPS: 100.0000 mg | ORAL_CAPSULE | Freq: Three times a day (TID) | ORAL | 0 refills | Status: DC

## 2017-04-30 MED FILL — BENZONATATE 100 MG CAPSULE: 100 | 7 days supply | Qty: 21 | Fill #0

## 2017-04-30 NOTE — ED Triage Notes (Signed)
Patient reports sore throat due to enlarged lymph nodes x 3 days.  Additionally c/o headache.

## 2017-04-30 NOTE — ED Provider Notes (Signed)
[facility] EMERGENCY DEPARTMENT Provider Note   CSN: [id number] Arrival date & time: 04/30/17  0909     History   Chief Complaint Chief Complaint  Patient presents with  . Sore Throat    HPI Mary Richardson is a 47 y.o. female.  47 yo F with a cc of cough congestion going on for past week.  She has been around her daughter who had a similar illness.  Is complaining of a sore throat and pain to the right lymph nodes.  Subjective fevers and chills.  She has tried over-the-counter remedies without improvement.  The history is provided by the patient.  Sore Throat  This is a new problem. The current episode started 2 days ago. The problem occurs constantly. The problem has not changed since onset.Pertinent negatives include no chest pain, no headaches and no shortness of breath. Nothing aggravates the symptoms. Nothing relieves the symptoms. She has tried nothing for the symptoms. The treatment provided no relief.    Past Medical History:  Diagnosis Date  . Asthma   . Gestational diabetes    glyburide  . Headache(784.0)   . Hepatitis B   . Hypertension   . Migraine     Patient Active Problem List   Diagnosis Date Noted  . Bilateral carpal tunnel syndrome 04/19/2017  . Trigger finger, acquired 02/06/2017  . Left knee pain 12/06/2016  . Vitamin D deficiency 03/13/2016  . Normochromic normocytic anemia 03/13/2016  . Benign essential hypertension 03/13/2016  . Type 2 diabetes mellitus without complication, without long-term current use of insulin (HCC) 08/28/2015  . Hyperlipidemia 08/28/2015  . Non morbid obesity due to excess calories 02/19/2015  . Gestational diabetes mellitus in childbirth 01/11/2013    Past Surgical History:  Procedure Laterality Date  . NO PAST SURGERIES       OB History    Gravida  2   Para  2   Term  1   Preterm  1   AB      Living  2     SAB      TAB      Ectopic      Multiple      Live Births  2             Home Medications    Prior to Admission medications   Medication Sig Start Date End Date Taking? Authorizing Provider  amoxicillin-clavulanate (AUGMENTIN) 875-125 MG tablet Take 1 tablet by mouth every 12 (twelve) hours. 04/30/17   Melene Plan, DO  benzonatate (TESSALON) 100 MG capsule Take 1 capsule (100 mg total) by mouth every 8 (eight) hours. 04/30/17   Melene Plan, DO  cetirizine (ZYRTEC ALLERGY) 10 MG tablet Take 1 tablet (10 mg total) by mouth daily. Patient not taking: Reported on 12/01/2016 03/11/15   Everlene Farrier, PA-C  ipratropium (ATROVENT) 0.06 % nasal spray Place 2 sprays into both nostrils 4 (four) times daily. Patient not taking: Reported on 12/01/2016 01/04/15   Linna Hoff, MD  meloxicam [facility]) 15 MG tablet  04/12/17   [provider]    Family History Family History  Problem Relation Age of Onset  . Diabetes Father   . Hypertension Father   . Stroke Father   . Asthma Brother   . Asthma Son   . Hearing loss Maternal Aunt   . Alcohol abuse Neg Hx   . Arthritis Neg Hx   . Birth defects Neg Hx   . Cancer Neg Hx   .  COPD Neg Hx   . Depression Neg Hx   . Drug abuse Neg Hx   . Early death Neg Hx   . Heart disease Neg Hx   . Hyperlipidemia Neg Hx   . Kidney disease Neg Hx   . Learning disabilities Neg Hx   . Mental illness Neg Hx   . Mental retardation Neg Hx   . Miscarriages / Stillbirths Neg Hx   . Vision loss Neg Hx     Social History Social History   Tobacco Use  . Smoking status: Never Smoker  . Smokeless tobacco: Never Used  Substance Use Topics  . Alcohol use: No  . Drug use: No     Allergies   Patient has no known allergies.   Review of Systems Review of Systems  Constitutional: Negative for chills and fever.  HENT: Positive for congestion, sinus pressure, sinus pain and sore throat. Negative for rhinorrhea.   Eyes: Negative for redness and visual disturbance.  Respiratory: Positive for cough. Negative for shortness of  breath and wheezing.   Cardiovascular: Negative for chest pain and palpitations.  Gastrointestinal: Negative for nausea and vomiting.  Genitourinary: Negative for dysuria and urgency.  Musculoskeletal: Negative for arthralgias and myalgias.  Skin: Negative for pallor and wound.  Neurological: Negative for dizziness and headaches.     Physical Exam Updated Vital Signs BP 117/79   Pulse 72   Temp 98.5 F (36.9 C) (Oral)   Resp 16   Ht 5\' 6"  (1.676 m)   Wt 97.5 kg (215 lb)   LMP 04/23/2017 (Approximate)   SpO2 100%   BMI 34.70 kg/m   Physical Exam  Constitutional: She is oriented to person, place, and time. She appears well-developed and well-nourished. No distress.  HENT:  Head: Normocephalic and atraumatic.  Swollen turbinates, posterior nasal drip,R frontal sinus exquisitely tender to percussion, tm with bilateral effusion.    Eyes: Pupils are equal, round, and reactive to light. EOM are normal.  Neck: Normal range of motion. Neck supple.  Cardiovascular: Normal rate and regular rhythm. Exam reveals no gallop and no friction rub.  No murmur heard. Pulmonary/Chest: Effort normal. She has no wheezes. She has no rales.  Abdominal: Soft. She exhibits no distension. There is no tenderness.  Musculoskeletal: She exhibits no edema or tenderness.  Neurological: She is alert and oriented to person, place, and time.  Skin: Skin is warm and dry. She is not diaphoretic.  Psychiatric: She has a normal mood and affect. Her behavior is normal.  Nursing note and vitals reviewed.    ED Treatments / Results  Labs (all labs ordered are listed, but only abnormal results are displayed) Labs Reviewed - No data to display  EKG None  Radiology No results found.  Procedures Procedures (including critical care time)  Medications Ordered in ED Medications - No data to display   Initial Impression / Assessment and Plan / ED Course  I have reviewed the triage vital signs and the  nursing notes.  Pertinent labs & imaging results that were available during my care of the patient were reviewed by me and considered in my medical decision making (see chart for details).     47 yo F with a viral-like syndrome.  The patient does have significant frontal sinus tenderness and so I will started on antibiotics for sinusitis.  Start home.  10:33 AM:  I have discussed the diagnosis/risks/treatment options with the patient and believe the pt to be eligible for discharge home  to follow-up with PCP. We also discussed returning to the ED immediately if new or worsening sx occur. We discussed the sx which are most concerning (e.g., sudden worsening pain, fever, inability to tolerate by mouth) that necessitate immediate return. Medications administered to the patient during their visit and any new prescriptions provided to the patient are listed below.  Medications given during this visit Medications - No data to display  The patient appears reasonably screen and/or stabilized for discharge and I doubt any other medical condition or other [facility] requiring further screening, evaluation, or treatment in the ED at this time prior to discharge.    Final Clinical Impressions(s) / ED Diagnoses   Final diagnoses:  Acute frontal sinusitis, recurrence not specified    ED Discharge Orders        Ordered    amoxicillin-clavulanate (AUGMENTIN) 875-125 MG tablet  Every 12 hours     04/30/17 1028    benzonatate (TESSALON) 100 MG capsule  Every 8 hours     04/30/17 1029       Melene PlanFloyd, Davaun Quintela, DO 04/30/17 1033

## 2017-05-23 ENCOUNTER — Emergency Department (HOSPITAL_BASED_OUTPATIENT_CLINIC_OR_DEPARTMENT_OTHER): Payer: BLUE CROSS/BLUE SHIELD

## 2017-05-23 ENCOUNTER — Emergency Department (HOSPITAL_BASED_OUTPATIENT_CLINIC_OR_DEPARTMENT_OTHER)
Admission: EM | Admit: 2017-05-23 | Discharge: 2017-05-23 | Disposition: A | Discharge status: Home / Self Care | Payer: BLUE CROSS/BLUE SHIELD | Attending: Emergency Medicine | Admitting: Emergency Medicine

## 2017-05-23 ENCOUNTER — Other Ambulatory Visit: Payer: Self-pay

## 2017-05-23 ENCOUNTER — Encounter (HOSPITAL_BASED_OUTPATIENT_CLINIC_OR_DEPARTMENT_OTHER): Payer: Self-pay | Admitting: Emergency Medicine

## 2017-05-23 DIAGNOSIS — R1011 Right upper quadrant pain: Secondary | ICD-10-CM | POA: Diagnosis not present

## 2017-05-23 DIAGNOSIS — R519 Headache, unspecified: Secondary | ICD-10-CM

## 2017-05-23 DIAGNOSIS — I10 Essential (primary) hypertension: Secondary | ICD-10-CM | POA: Diagnosis not present

## 2017-05-23 DIAGNOSIS — M791 Myalgia, unspecified site: Secondary | ICD-10-CM | POA: Insufficient documentation

## 2017-05-23 DIAGNOSIS — R51 Headache: Secondary | ICD-10-CM | POA: Insufficient documentation

## 2017-05-23 DIAGNOSIS — Z79899 Other long term (current) drug therapy: Secondary | ICD-10-CM | POA: Insufficient documentation

## 2017-05-23 DIAGNOSIS — R509 Fever, unspecified: Secondary | ICD-10-CM | POA: Diagnosis present

## 2017-05-23 DIAGNOSIS — D649 Anemia, unspecified: Secondary | ICD-10-CM | POA: Diagnosis not present

## 2017-05-23 DIAGNOSIS — N281 Cyst of kidney, acquired: Secondary | ICD-10-CM

## 2017-05-23 DIAGNOSIS — J020 Streptococcal pharyngitis: Secondary | ICD-10-CM | POA: Diagnosis not present

## 2017-05-23 DIAGNOSIS — R52 Pain, unspecified: Secondary | ICD-10-CM

## 2017-05-23 HISTORY — DX: Anemia, unspecified: D64.9

## 2017-05-23 LAB — CBC WITH DIFFERENTIAL/PLATELET
BASOS PCT: 0 %
Basophils Absolute: 0 10*3/uL (ref 0.0–0.1)
Eosinophils Absolute: 0 10*3/uL (ref 0.0–0.7)
Eosinophils Relative: 1 %
HEMATOCRIT: 26.7 % — AB (ref 36.0–46.0)
Hemoglobin: 8.4 g/dL — ABNORMAL LOW (ref 12.0–15.0)
LYMPHS PCT: 17 %
Lymphs Abs: 1.2 10*3/uL (ref 0.7–4.0)
MCH: 24.1 pg — ABNORMAL LOW (ref 26.0–34.0)
MCHC: 31.5 g/dL (ref 30.0–36.0)
MCV: 76.7 fL — AB (ref 78.0–100.0)
Monocytes Absolute: 0.8 10*3/uL (ref 0.1–1.0)
Monocytes Relative: 11 %
NEUTROS ABS: 5.2 10*3/uL (ref 1.7–7.7)
NEUTROS PCT: 71 %
Platelets: 321 10*3/uL (ref 150–400)
RBC: 3.48 MIL/uL — AB (ref 3.87–5.11)
RDW: 15.3 % (ref 11.5–15.5)
WBC: 7.3 10*3/uL (ref 4.0–10.5)

## 2017-05-23 LAB — COMPREHENSIVE METABOLIC PANEL
ALBUMIN: 3.4 g/dL — AB (ref 3.5–5.0)
ALT: 13 U/L — AB (ref 14–54)
AST: 18 U/L (ref 15–41)
Alkaline Phosphatase: 62 U/L (ref 38–126)
Anion gap: 4 — ABNORMAL LOW (ref 5–15)
BILIRUBIN TOTAL: 0.4 mg/dL (ref 0.3–1.2)
BUN: 11 mg/dL (ref 6–20)
CALCIUM: 8.1 mg/dL — AB (ref 8.9–10.3)
CHLORIDE: 106 mmol/L (ref 101–111)
CO2: 23 mmol/L (ref 22–32)
Creatinine, Ser: 0.54 mg/dL (ref 0.44–1.00)
GFR calc Af Amer: >60 mL/min (ref >60)
GLUCOSE: 91 mg/dL (ref 65–99)
Potassium: 3.4 mmol/L — ABNORMAL LOW (ref 3.5–5.1)
Sodium: 133 mmol/L — ABNORMAL LOW (ref 135–145)
TOTAL PROTEIN: 7.6 g/dL (ref 6.5–8.1)

## 2017-05-23 LAB — URINALYSIS, ROUTINE W REFLEX MICROSCOPIC
BILIRUBIN URINE: NEGATIVE
Glucose, UA: NEGATIVE mg/dL
Hgb urine dipstick: NEGATIVE
KETONES UR: NEGATIVE mg/dL
LEUKOCYTES UA: NEGATIVE
NITRITE: NEGATIVE
PH: 6 (ref 5.0–8.0)
PROTEIN: NEGATIVE mg/dL
Specific Gravity, Urine: >1.03 — ABNORMAL HIGH (ref 1.005–1.030)

## 2017-05-23 LAB — RAPID STREP SCREEN (MED CTR MEBANE ONLY): STREPTOCOCCUS, GROUP A SCREEN (DIRECT): POSITIVE — AB

## 2017-05-23 LAB — LIPASE, BLOOD: LIPASE: 21 U/L (ref 11–51)

## 2017-05-23 LAB — PREGNANCY, URINE: Preg Test, Ur: NEGATIVE

## 2017-05-23 MED ORDER — FERROUS SULFATE 325 (65 FE) MG PO TABS: 325.0000 mg | ORAL_TABLET | Freq: Every day | ORAL | 0 refills | Status: DC

## 2017-05-23 MED ORDER — SODIUM CHLORIDE 0.9 % IV BOLUS
1000.0000 mL | Freq: Once | INTRAVENOUS | Status: AC
  Administered 2017-05-23: 1000 mL via INTRAVENOUS

## 2017-05-23 MED ORDER — IBUPROFEN 800 MG PO TABS
800.0000 mg | ORAL_TABLET | Freq: Once | ORAL | Status: AC
  Administered 2017-05-23: 800 mg via ORAL

## 2017-05-23 MED ORDER — IBUPROFEN 800 MG PO TABS
ORAL_TABLET | ORAL | Status: DC
Start: 2017-05-23 — End: 2017-05-24
  Filled 2017-05-23: qty 1

## 2017-05-23 MED ORDER — PENICILLIN G BENZATHINE 1200000 UNIT/2ML IM SUSP
1.2000 10*6.[IU] | Freq: Once | INTRAMUSCULAR | Status: AC
  Administered 2017-05-23: 1.2 10*6.[IU] via INTRAMUSCULAR
  Filled 2017-05-23: qty 2

## 2017-05-23 MED ORDER — KETOROLAC TROMETHAMINE 30 MG/ML IJ SOLN
15.0000 mg | Freq: Once | INTRAMUSCULAR | Status: AC
  Administered 2017-05-23: 15 mg via INTRAVENOUS
  Filled 2017-05-23: qty 1

## 2017-05-23 NOTE — Discharge Instructions (Addendum)
Your strep test today was positive, which is likely the cause of your symptoms. You were treated for strep throat today, so you should start to notice improvement in the next few days. Continue to stay well-hydrated. Gargle warm salt water and spit it out and use chloraseptic spray as needed for sore throat. Continue to alternate between Tylenol and Ibuprofen for pain or fever. Use Mucinex for cough suppression/expectoration of mucus. Use netipot and flonase to help with nasal congestion. May consider over-the-counter Benadryl or other antihistamine to decrease secretions and for help with your symptoms.   Your labs showed that you have anemia, which is a chronic issue; take iron as directed. Iron may make you constipated, if that occurs then use over the counter miralax or colace to help soften stools and lessen constipation. Your stool may turn black with the iron supplements, this is a normal side effect.   The rest of your work up today was reassuring. Your abdominal pain could be from a variety of things, however today's work up was reassuring for any emergent causes. You can try using over the counter tums, maalox, zantac, pepto bismol, etc to help with symptoms; use tylenol for pain; stay well hydrated; avoid fatty/fried/acidic foods, follow the list of foods below to help with abdominal pain symptoms. Your ultrasound showed cysts on your kidneys, please follow up with your regular doctor for ongoing monitoring of this finding.  Follow up with your primary care doctor in 5-7 days for recheck of ongoing symptoms and ongoing management of your conditions. Return to emergency department for emergent changing or worsening of symptoms.

## 2017-05-23 NOTE — ED Provider Notes (Signed)
[facility] EMERGENCY DEPARTMENT Provider Note   CSN: [id number] Arrival date & time: 05/23/17  1719     History   Chief Complaint Chief Complaint  Patient presents with  . Generalized Body Aches    HPI Mary Richardson is a 47 y.o. female with a PMHx of asthma, anemia, gestational diabetes, headaches/migraines, HTN, HLD, and Hep B, who presents to the ED with complaints of fever, chills, body aches, headaches, lateral abdominal pain radiating to her flanks, and right tonsillar lymph node swelling for the last 3 days.  She reports Tmax 103 at home.  States that she is been having headaches, and also complains of 10/10 intermittent sharp lateral abdominal pain that radiates to bilateral flanks, worse with laying on her sides, and unrelieved with Tylenol.  She has been having generalized body aches as well.  Lastly she mentions that her right tonsil lymph node is swollen and uncomfortable.  She had a sinus infection/URI about a month ago and was treated with Augmentin and Tessalon and that resolved.  At that time she also mentioned having R tonsillar lymph node swelling as well, however she denies any ongoing URI symptoms otherwise.  She reports taking NSAIDs daily.  Her PCP is Dr. Norma Fredrickson.  She denies any ear pain or drainage, sore throat, cough, rhinorrhea, sinus congestion, neck stiffness, vision changes, lightheadedness, CP, SOB, n/v/d/c, melena, hematochezia, dysuria, hematuria, increased urinary frequency, vaginal discharge, numbness, tingling, focal weakness, or any other complaints at this time.  She denies any recent travel, sick contacts, suspicious food intake, or recent alcohol use.  She is currently on her menses.   The history is provided by the patient and medical records. No language interpreter was used.    Past Medical History:  Diagnosis Date  . Anemia   . Asthma   . Gestational diabetes    glyburide  . Headache(784.0)   . Hepatitis B   . Hypertension   .  Migraine     Patient Active Problem List   Diagnosis Date Noted  . Bilateral carpal tunnel syndrome 04/19/2017  . Trigger finger, acquired 02/06/2017  . Left knee pain 12/06/2016  . Vitamin D deficiency 03/13/2016  . Normochromic normocytic anemia 03/13/2016  . Benign essential hypertension 03/13/2016  . Type 2 diabetes mellitus without complication, without long-term current use of insulin (HCC) 08/28/2015  . Hyperlipidemia 08/28/2015  . Non morbid obesity due to excess calories 02/19/2015  . Gestational diabetes mellitus in childbirth 01/11/2013    Past Surgical History:  Procedure Laterality Date  . NO PAST SURGERIES       OB History    Gravida  2   Para  2   Term  1   Preterm  1   AB      Living  2     SAB      TAB      Ectopic      Multiple      Live Births  2            Home Medications    Prior to Admission medications   Medication Sig Start Date End Date Taking? Authorizing Provider  celecoxib (CELEBREX) 100 MG capsule Take 100 mg by mouth 2 (two) times daily.   Yes [provider]  amoxicillin-clavulanate (AUGMENTIN) 875-125 MG tablet Take 1 tablet by mouth every 12 (twelve) hours. 04/30/17   Melene Plan, DO  benzonatate (TESSALON) 100 MG capsule Take 1 capsule (100 mg total) by mouth every  8 (eight) hours. 04/30/17   Melene Plan, DO  cetirizine (ZYRTEC ALLERGY) 10 MG tablet Take 1 tablet (10 mg total) by mouth daily. Patient not taking: Reported on 12/01/2016 03/11/15   Everlene Farrier, PA-C  ipratropium (ATROVENT) 0.06 % nasal spray Place 2 sprays into both nostrils 4 (four) times daily. Patient not taking: Reported on 12/01/2016 01/04/15   Linna Hoff, MD  meloxicam [facility]) 15 MG tablet  04/12/17   [provider]    Family History Family History  Problem Relation Age of Onset  . Diabetes Father   . Hypertension Father   . Stroke Father   . Asthma Brother   . Asthma Son   . Hearing loss Maternal Aunt   . Alcohol  abuse Neg Hx   . Arthritis Neg Hx   . Birth defects Neg Hx   . Cancer Neg Hx   . COPD Neg Hx   . Depression Neg Hx   . Drug abuse Neg Hx   . Early death Neg Hx   . Heart disease Neg Hx   . Hyperlipidemia Neg Hx   . Kidney disease Neg Hx   . Learning disabilities Neg Hx   . Mental illness Neg Hx   . Mental retardation Neg Hx   . Miscarriages / Stillbirths Neg Hx   . Vision loss Neg Hx     Social History Social History   Tobacco Use  . Smoking status: Never Smoker  . Smokeless tobacco: Never Used  Substance Use Topics  . Alcohol use: No  . Drug use: No     Allergies   Patient has no known allergies.   Review of Systems Review of Systems  Constitutional: Positive for chills and fever (Tmax 103).  HENT: Negative for congestion, ear discharge, ear pain, rhinorrhea and sore throat.   Eyes: Negative for visual disturbance.  Respiratory: Negative for cough and shortness of breath.   Cardiovascular: Negative for chest pain.  Gastrointestinal: Positive for abdominal pain. Negative for blood in stool, constipation, diarrhea, nausea and vomiting.  Genitourinary: Positive for flank pain. Negative for dysuria, frequency, hematuria and vaginal discharge.  Musculoskeletal: Positive for myalgias (body aches). Negative for arthralgias and neck stiffness.  Skin: Negative for color change.  Allergic/Immunologic: Negative for immunocompromised state.  Neurological: Positive for headaches. Negative for weakness, light-headedness and numbness.  Hematological: Positive for adenopathy.  Psychiatric/Behavioral: Negative for confusion.   All other systems reviewed and are negative for acute change except as noted in the HPI.    Physical Exam Updated Vital Signs BP 128/84 (BP Location: Right Arm)   Pulse 91   Temp (!) 102.1 F (38.9 C) (Oral)   Resp 18   Ht  (1.676 m)   Wt 97.5 kg (215 lb)   LMP 05/23/2017   SpO2 100%   BMI 34.70 kg/m   Exam VS: BP 127/79 (BP Location: Left  Arm)   Pulse 70   Temp 98.8 F (37.1 C) (Oral)   Resp 18   SpO2 99%   Physical Exam  Constitutional: She is oriented to person, place, and time. Vital signs are normal. She appears well-developed and well-nourished.  Non-toxic appearance. No distress.  Febrile up to 102.1 in triage which came down to 98.8 after ibuprofen; nontoxic appearing, overall well appearing in NAD, not septic appearing.   HENT:  Head: Normocephalic and atraumatic.  Nose: Nose normal.  Mouth/Throat: Uvula is midline and mucous membranes are normal. No trismus in the jaw. No uvula  swelling. Oropharyngeal exudate, posterior oropharyngeal edema and posterior oropharyngeal erythema present. No tonsillar abscesses. Tonsils are 3+ on the right. Tonsils are 3+ on the left. Tonsillar exudate.  Nose clear. Oropharynx injected, without uvular swelling or deviation, no trismus or drooling, with 3+ bilateral tonsillar swelling and erythema, +exudates.  No PTA.  Eyes: Pupils are equal, round, and reactive to light. Conjunctivae and EOM are normal. Right eye exhibits no discharge. Left eye exhibits no discharge.  PERRL, EOMI, no nystagmus, no visual field deficits   Neck: Normal range of motion. Neck supple. No spinous process tenderness and no muscular tenderness present. No neck rigidity. Normal range of motion present. No Brudzinski's sign and no Kernig's sign noted.  FROM intact without spinous process TTP, no bony stepoffs or deformities, no paraspinous muscle TTP or muscle spasms. No rigidity or meningeal signs, neg kernig's and brudzinski's. No bruising or swelling.   Cardiovascular: Normal rate, regular rhythm, normal heart sounds and intact distal pulses. Exam reveals no gallop and no friction rub.  No murmur heard. Pulmonary/Chest: Effort normal and breath sounds normal. No respiratory distress. She has no decreased breath sounds. She has no wheezes. She has no rhonchi. She has no rales.  CTAB in all lung fields, no w/r/r, no  hypoxia or increased WOB, speaking in full sentences, SpO2 100% on RA   Abdominal: Soft. Normal appearance and bowel sounds are normal. She exhibits no distension. There is tenderness in the right upper quadrant and epigastric area. There is positive Murphy's sign. There is no rigidity, no rebound, no guarding, no CVA tenderness and no tenderness at McBurney's point.  Soft, nondistended, +BS throughout, with moderate RUQ and epigastric TTP, no r/g/r, +murphy's, neg mcburney's, no CVA TTP   Musculoskeletal: Normal range of motion.  MAE x4 Strength and sensation grossly intact in all extremities Distal pulses intact Gait steady  Lymphadenopathy:       Head (right side): Tonsillar adenopathy present.    She has cervical adenopathy.  Shotty cervical LAD bilaterally, R tonsillar LAD which is mildly TTP  Neurological: She is alert and oriented to person, place, and time. She has normal strength. No cranial nerve deficit or sensory deficit. Coordination and gait normal. GCS eye subscore is 4. GCS verbal subscore is 5. GCS motor subscore is 6.  CN 2-12 grossly intact A&O x4 GCS 15 Sensation and strength intact Gait nonataxic including with tandem walking Coordination with finger-to-nose WNL Neg pronator drift   Skin: Skin is warm, dry and intact. No rash noted.  Psychiatric: She has a normal mood and affect.  Nursing note and vitals reviewed.    ED Treatments / Results  Labs (all labs ordered are listed, but only abnormal results are displayed) Labs Reviewed  RAPID STREP SCREEN ([facility] ONLY) - Abnormal; Notable for the following components:      Result Value   Streptococcus, Group A Screen (Direct) POSITIVE (*)    All other components within normal limits  CBC WITH DIFFERENTIAL/PLATELET - Abnormal; Notable for the following components:   RBC 3.48 (*)    Hemoglobin 8.4 (*)    HCT 26.7 (*)    MCV 76.7 (*)    MCH 24.1 (*)    All other components within normal limits  COMPREHENSIVE  METABOLIC PANEL - Abnormal; Notable for the following components:   Sodium 133 (*)    Potassium 3.4 (*)    Calcium 8.1 (*)    Albumin 3.4 (*)    ALT 13 (*)  Anion gap 4 (*)    All other components within normal limits  URINALYSIS, ROUTINE W REFLEX MICROSCOPIC - Abnormal; Notable for the following components:   Specific Gravity, Urine >1.030 (*)    All other components within normal limits  LIPASE, BLOOD  PREGNANCY, URINE    EKG None  Radiology US Abdomen Complete  Result Date: 05/23/2017 CLINICAL DATA:  Abdominal pain for 2 days, history of hepatitis-B EXAM: ABDOMEN ULTRASOUND COMPLETE COMPARISON:  01/13/2006 CT of the abdomen. FINDINGS: Gallbladder: No gallstones or wall thickening visualized. No sonographic Murphy sign noted by sonographer. Common bile duct: Diameter: 2 mm Liver: No focal lesion identified. Within normal limits in parenchymal echogenicity. Portal vein is patent on color Doppler imaging with normal direction of blood flow towards the liver. IVC: No abnormality visualized. Pancreas: Visualized portion unremarkable. Spleen: Size and appearance within normal limits. Right Kidney: Length: 10.9 cm. Echogenicity is within normal limits. No obstructive changes are noted. There is a 1.6 x 1.6 cm cystic lesion identified with mild internal septations. This is slightly larger than that seen on the prior CT examination. Left Kidney: Length: 2.5 cm. 2 cm cyst is noted stable from the prior CT. Abdominal aorta: No aneurysm visualized. Other findings: None. IMPRESSION: Mildly complex renal cyst on the right as described. Short-term follow-up in 3-6 months is recommended to assess for stability. Stable left renal cyst. No acute abnormality noted. Electronically Signed   By: Alcide Clever M.D.   On: 05/23/2017 21:10    Procedures Procedures (including critical care time)  Medications Ordered in ED Medications  ibuprofen (ADVIL,MOTRIN) tablet 800 mg (800 mg Oral Given 05/23/17 1740)    sodium chloride 0.9 % bolus 1,000 mL (1,000 mLs Intravenous New Bag/Given 05/23/17 1935)  ketorolac (TORADOL) 30 MG/ML injection 15 mg (15 mg Intravenous Given 05/23/17 1939)  penicillin g benzathine (BICILLIN LA) 1200000 UNIT/2ML injection 1.2 Million Units (1.2 Million Units Intramuscular Given 05/23/17 2010)     Initial Impression / Assessment and Plan / ED Course  I have reviewed the triage vital signs and the nursing notes.  Pertinent labs & imaging results that were available during my care of the patient were reviewed by me and considered in my medical decision making (see chart for details).     47 y.o. female here with body aches, chills/fever, R tonsillar lymph node swelling/pain, headaches, and lateral/upper abdominal pain radiating to flanks bilaterally. On exam, febrile in triage up to 102.1 however pt well appearing and VS otherwise unremarkable, not septic appearing; temp down to 98.8 after ibuprofen; no meningismus, no focal neuro deficits, tonsils injected and with 3+ bilateral swelling and exudates but no PTA; R tonsillar LAD which is TTP; lungs clear; abd with RUQ and epigastric TTP and +murphy's, no other areas of tenderness elsewhere in the abdomen, no flank tenderness, nonperitoneal. Overall pt actually well appearing. Doubt need for CT head, doubt meningitis, headaches are likely secondary to the fever. Will get labs, U/A, and strep testing as well as abd U/S to further evaluate her symptoms. Doubt need for CXR at this time given her lack of cough or pulmonary complaints. Will give fluids and toradol and reassess shortly.   9:43 PM CBC w/diff with chronic stable microcytic anemia (Hgb 8.4/Hct 26.7 which is stable; pt states she is not on iron currently but was aware that she was anemic, will need to restart iron; doubt need for transfusion given lack of symptoms from anemia), CBC otherwise unremarkable and without leukocytosis. CMP with mildly  low K 3.4 but doubt need for  repletion, otherwise rest of CMP fairly unremarkable. Lipase WNL. U/A unremarkable and without evidence for UTI. Upreg neg. Strep test positive, which could be where her symptoms are coming from, pt opted for PCN here to treat this. Abd U/S with mildly complex R renal cyst slightly larger than prior CT, recommending f/up in 3-6 months; also with stable L renal cyst; otherwise no acute abnormalities. Gallbladder and biliary tree without acute findings. Pt feeling better at this time. Overall, most of her symptoms are likely due to the strep throat; her abdominal pain could just be from this, vs indigestion vs biliary colic/dyskinesia, etc. Doubt need for further emergent work up at this time. Advised tylenol/motrin for pain/fevers, OTC remedies for symptomatic relief, diet modifications for abd symptoms, and importance of adequate hydration and rest. Will start on iron. F/up with PCP in 1wk for recheck of symptoms and for ongoing monitoring of her renal cysts. I explained the diagnosis and have given explicit precautions to return to the ER including for any other new or worsening symptoms. The patient understands and accepts the medical plan as it's been dictated and I have answered their questions. Discharge instructions concerning home care and prescriptions have been given. The patient is STABLE and is discharged to home in good condition.     Final Clinical Impressions(s) / ED Diagnoses   Final diagnoses:  Strep pharyngitis  Fever in adult  Body aches  Nonintractable headache, unspecified chronicity pattern, unspecified headache type  RUQ abdominal pain  Chronic anemia  Bilateral renal cysts    ED Discharge Orders        Ordered    ferrous sulfate 325 (65 FE) MG tablet  Daily with breakfast     05/23/17 [address] [place], [place] 05/23/17 2144    Charlynne Pander, MD 05/23/17 [phone]

## 2017-05-23 NOTE — ED Notes (Signed)
Pt given d/c instructions as per chart. Rx x 1. Verbalizes understanding. No questions. 

## 2017-05-23 NOTE — ED Triage Notes (Signed)
Patient states that she has a headache and fever x 2 -3 days. She reports generalized aches. The patient reports that she took tylenol about 4 hours ago

## 2017-06-24 ENCOUNTER — Encounter (HOSPITAL_BASED_OUTPATIENT_CLINIC_OR_DEPARTMENT_OTHER): Payer: Self-pay | Admitting: *Deleted

## 2017-06-24 ENCOUNTER — Other Ambulatory Visit: Payer: Self-pay

## 2017-06-29 ENCOUNTER — Ambulatory Visit: Payer: Self-pay | Admitting: Otolaryngology

## 2017-06-29 NOTE — H&P (Signed)
PREOPERATIVE H&P  Chief Complaint: recurrent tonsil infections  HPI: Mary Richardson is a 47 y.o. female who presents for evaluation of recurrent tonsil infections. She's had numerous tonsil infections over the past 2 years. She was recently treated at [facility] for tonsil infection that was strep positive. She is taken to the operating room at this time for tonsillectomy. She is otherwise healthy  Past Medical History:  Diagnosis Date  . Anemia   . Asthma    no issues as adult  . Headache(784.0)   . Hepatitis B   . Hypertension    during pregnancy  . Migraine   . Tonsillitis, chronic    Past Surgical History:  Procedure Laterality Date  . NO PAST SURGERIES     Social History   Socioeconomic History  . Marital status: Single    Spouse name: Not on file  . Number of children: Not on file  . Years of education: Not on file  . Highest education level: Not on file  Occupational History  . Not on file  Social Needs  . Financial resource strain: Not on file  . Food insecurity:    Worry: Not on file    Inability: Not on file  . Transportation needs:    Medical: Not on file    Non-medical: Not on file  Tobacco Use  . Smoking status: Never Smoker  . Smokeless tobacco: Never Used  Substance and Sexual Activity  . Alcohol use: No  . Drug use: No  . Sexual activity: Not Currently    Birth control/protection: None  Lifestyle  . Physical activity:    Days per week: Not on file    Minutes per session: Not on file  . Stress: Not on file  Relationships  . Social connections:    Talks on phone: Not on file    Gets together: Not on file    Attends religious service: Not on file    Active member of club or organization: Not on file    Attends meetings of clubs or organizations: Not on file    Relationship status: Not on file  Other Topics Concern  . Not on file  Social History Narrative  . Not on file   Family History  Problem Relation Age of Onset  . Diabetes  Father   . Hypertension Father   . Stroke Father   . Asthma Brother   . Asthma Son   . Hearing loss Maternal Aunt   . Alcohol abuse Neg Hx   . Arthritis Neg Hx   . Birth defects Neg Hx   . Cancer Neg Hx   . COPD Neg Hx   . Depression Neg Hx   . Drug abuse Neg Hx   . Early death Neg Hx   . Heart disease Neg Hx   . Hyperlipidemia Neg Hx   . Kidney disease Neg Hx   . Learning disabilities Neg Hx   . Mental illness Neg Hx   . Mental retardation Neg Hx   . Miscarriages / Stillbirths Neg Hx   . Vision loss Neg Hx    No Known Allergies Prior to Admission medications   Medication Sig Start Date End Date Taking? Authorizing Provider  celecoxib (CELEBREX) 100 MG capsule Take 100 mg by mouth 2 (two) times daily.   Yes [provider]  ferrous sulfate 325 (65 FE) MG tablet Take 1 tablet (325 mg total) by mouth daily with breakfast. TAKE WITH ORANGE JUICE 05/23/17  [address], [place], [place]     Positive ROS: frequent sore throats  All other systems have been reviewed and were otherwise negative with the exception of those mentioned in the HPI and as above.  Physical Exam: There were no vitals filed for this visit.  General: Alert, no acute distress Oral: Normal oral mucosa and 3+ tonsils. No exudate Nasal: Clear nasal passages Neck: No palpable adenopathy or thyroid nodules Ear: Ear canal is clear with normal appearing TMs Cardiovascular: Regular rate and rhythm, no murmur.  Respiratory: Clear to auscultation Neurologic: Alert and oriented x 3   Assessment/Plan: CHRONIC TONSILITIS Plan for Procedure(s): TONSILLECTOMY   Dillard Cannonhristopher Newman, MD 06/29/2017 12:24 PM

## 2017-06-29 NOTE — Anesthesia Preprocedure Evaluation (Addendum)
Anesthesia Evaluation  Patient identified by MRN, date of birth, ID band Patient awake    Reviewed: Allergy & Precautions, NPO status , Patient's Chart, lab work & pertinent test results  Airway Mallampati: III  TM Distance: >3 FB Neck ROM: Full    Dental  (+) Dental Advisory Given   Pulmonary asthma , sleep apnea ,    breath sounds clear to auscultation       Cardiovascular hypertension,  Rhythm:Regular Rate:Normal     Neuro/Psych  Headaches, negative psych ROS   GI/Hepatic negative GI ROS, (+) Hepatitis -, B  Endo/Other  Obesity  Renal/GU negative Renal ROS  negative genitourinary   Musculoskeletal negative musculoskeletal ROS (+)   Abdominal   Peds  Hematology negative hematology ROS (+)   Anesthesia Other Findings   Reproductive/Obstetrics                            Anesthesia Physical Anesthesia Plan  ASA: II  Anesthesia Plan: General   Post-op Pain Management:    Induction: Intravenous  PONV Risk Score and Plan: 3 and Treatment may vary due to age or medical condition, Ondansetron, Dexamethasone, Midazolam and Scopolamine patch - Pre-op  Airway Management Planned: Oral ETT  Additional Equipment: None  Intra-op Plan:   Post-operative Plan: Extubation in OR  Informed Consent: I have reviewed the patients History and Physical, chart, labs and discussed the procedure including the risks, benefits and alternatives for the proposed anesthesia with the patient or authorized representative who has indicated his/her understanding and acceptance.   Dental advisory given  Plan Discussed with: CRNA and Anesthesiologist  Anesthesia Plan Comments:       Anesthesia Quick Evaluation

## 2017-06-30 ENCOUNTER — Ambulatory Visit (HOSPITAL_BASED_OUTPATIENT_CLINIC_OR_DEPARTMENT_OTHER)
Admission: RE | Admit: 2017-06-30 | Discharge: 2017-06-30 | Disposition: A | Discharge status: Home / Self Care | Payer: BLUE CROSS/BLUE SHIELD | Source: Ambulatory Visit | Attending: Otolaryngology | Admitting: Otolaryngology

## 2017-06-30 ENCOUNTER — Ambulatory Visit (HOSPITAL_BASED_OUTPATIENT_CLINIC_OR_DEPARTMENT_OTHER): Payer: BLUE CROSS/BLUE SHIELD | Admitting: Anesthesiology

## 2017-06-30 ENCOUNTER — Encounter (HOSPITAL_BASED_OUTPATIENT_CLINIC_OR_DEPARTMENT_OTHER)
Admission: RE | Disposition: A | Discharge status: Home / Self Care | Payer: Self-pay | Source: Ambulatory Visit | Attending: Otolaryngology

## 2017-06-30 ENCOUNTER — Encounter (HOSPITAL_BASED_OUTPATIENT_CLINIC_OR_DEPARTMENT_OTHER): Payer: Self-pay

## 2017-06-30 ENCOUNTER — Other Ambulatory Visit: Payer: Self-pay

## 2017-06-30 DIAGNOSIS — Z79899 Other long term (current) drug therapy: Secondary | ICD-10-CM | POA: Diagnosis not present

## 2017-06-30 DIAGNOSIS — Z8619 Personal history of other infectious and parasitic diseases: Secondary | ICD-10-CM | POA: Insufficient documentation

## 2017-06-30 DIAGNOSIS — J3501 Chronic tonsillitis: Secondary | ICD-10-CM | POA: Insufficient documentation

## 2017-06-30 DIAGNOSIS — E669 Obesity, unspecified: Secondary | ICD-10-CM | POA: Insufficient documentation

## 2017-06-30 DIAGNOSIS — G4730 Sleep apnea, unspecified: Secondary | ICD-10-CM | POA: Diagnosis not present

## 2017-06-30 DIAGNOSIS — Z6834 Body mass index (BMI) 34.0-34.9, adult: Secondary | ICD-10-CM | POA: Insufficient documentation

## 2017-06-30 HISTORY — PX: TONSILLECTOMY: SHX5217

## 2017-06-30 HISTORY — DX: Chronic tonsillitis: J35.01

## 2017-06-30 SURGERY — TONSILLECTOMY
Anesthesia: General | Site: Throat

## 2017-06-30 MED ORDER — CEFAZOLIN SODIUM-DEXTROSE 2-4 GM/100ML-% IV SOLN
2.0000 g | INTRAVENOUS | Status: AC
  Administered 2017-06-30: 2 g via INTRAVENOUS

## 2017-06-30 MED ORDER — MIDAZOLAM HCL 2 MG/2ML IJ SOLN
INTRAMUSCULAR | Status: AC
  Filled 2017-06-30: qty 2

## 2017-06-30 MED ORDER — PROMETHAZINE HCL 25 MG/ML IJ SOLN: 6.2500 mg | INTRAMUSCULAR | Status: DC | PRN

## 2017-06-30 MED ORDER — MIDAZOLAM HCL 2 MG/2ML IJ SOLN
1.0000 mg | INTRAMUSCULAR | Status: DC | PRN
  Administered 2017-06-30: 2 mg via INTRAVENOUS

## 2017-06-30 MED ORDER — LIDOCAINE HCL (CARDIAC) PF 100 MG/5ML IV SOSY
PREFILLED_SYRINGE | INTRAVENOUS | Status: DC | PRN
  Administered 2017-06-30: 100 mg via INTRAVENOUS

## 2017-06-30 MED ORDER — FENTANYL CITRATE (PF) 100 MCG/2ML IJ SOLN
INTRAMUSCULAR | Status: AC
  Filled 2017-06-30: qty 2

## 2017-06-30 MED ORDER — CHLORHEXIDINE GLUCONATE CLOTH 2 % EX PADS: 6.0000 | MEDICATED_PAD | Freq: Once | CUTANEOUS | Status: DC

## 2017-06-30 MED ORDER — ONDANSETRON HCL 4 MG/2ML IJ SOLN
INTRAMUSCULAR | Status: AC
  Filled 2017-06-30: qty 2

## 2017-06-30 MED ORDER — OXYCODONE HCL 5 MG/5ML PO SOLN: 5.0000 mg | Freq: Once | ORAL | Status: DC | PRN

## 2017-06-30 MED ORDER — DEXAMETHASONE SODIUM PHOSPHATE 4 MG/ML IJ SOLN
INTRAMUSCULAR | Status: DC | PRN
  Administered 2017-06-30: 10 mg via INTRAVENOUS

## 2017-06-30 MED ORDER — HYDROCODONE-ACETAMINOPHEN 7.5-325 MG/15ML PO SOLN: 10.0000 mL | Freq: Four times a day (QID) | ORAL | 0 refills | Status: DC | PRN

## 2017-06-30 MED ORDER — FENTANYL CITRATE (PF) 100 MCG/2ML IJ SOLN
50.0000 ug | INTRAMUSCULAR | Status: DC | PRN
  Administered 2017-06-30 (×2): 100 ug via INTRAVENOUS

## 2017-06-30 MED ORDER — SCOPOLAMINE 1 MG/3DAYS TD PT72: 1.0000 | MEDICATED_PATCH | Freq: Once | TRANSDERMAL | Status: DC | PRN

## 2017-06-30 MED ORDER — DEXAMETHASONE SODIUM PHOSPHATE 10 MG/ML IJ SOLN
INTRAMUSCULAR | Status: AC
  Filled 2017-06-30: qty 1

## 2017-06-30 MED ORDER — PHENYLEPHRINE 40 MCG/ML (10ML) SYRINGE FOR IV PUSH (FOR BLOOD PRESSURE SUPPORT)
PREFILLED_SYRINGE | INTRAVENOUS | Status: AC
  Filled 2017-06-30: qty 10

## 2017-06-30 MED ORDER — AMOXICILLIN 250 MG/5ML PO SUSR: 500.0000 mg | Freq: Two times a day (BID) | ORAL | 0 refills | Status: DC

## 2017-06-30 MED ORDER — CEFAZOLIN SODIUM-DEXTROSE 2-4 GM/100ML-% IV SOLN
INTRAVENOUS | Status: AC
  Filled 2017-06-30: qty 100

## 2017-06-30 MED ORDER — OXYCODONE HCL 5 MG PO TABS: 5.0000 mg | ORAL_TABLET | Freq: Once | ORAL | Status: DC | PRN

## 2017-06-30 MED ORDER — LIDOCAINE HCL (CARDIAC) PF 100 MG/5ML IV SOSY
PREFILLED_SYRINGE | INTRAVENOUS | Status: AC
  Filled 2017-06-30: qty 5

## 2017-06-30 MED ORDER — ONDANSETRON HCL 4 MG/2ML IJ SOLN
INTRAMUSCULAR | Status: DC | PRN
Start: 2017-06-30 — End: 2017-06-30
  Administered 2017-06-30: 4 mg via INTRAVENOUS

## 2017-06-30 MED ORDER — SUCCINYLCHOLINE CHLORIDE 200 MG/10ML IV SOSY
PREFILLED_SYRINGE | INTRAVENOUS | Status: AC
  Filled 2017-06-30: qty 10

## 2017-06-30 MED ORDER — LACTATED RINGERS IV SOLN
INTRAVENOUS | Status: DC
  Administered 2017-06-30 (×2): via INTRAVENOUS

## 2017-06-30 MED ORDER — FENTANYL CITRATE (PF) 100 MCG/2ML IJ SOLN
25.0000 ug | INTRAMUSCULAR | Status: DC | PRN
  Administered 2017-06-30 (×3): 25 ug via INTRAVENOUS

## 2017-06-30 MED ORDER — PROPOFOL 500 MG/50ML IV EMUL
INTRAVENOUS | Status: AC
Start: 2017-06-30 — End: ?
  Filled 2017-06-30: qty 50

## 2017-06-30 MED ORDER — EPHEDRINE SULFATE 50 MG/ML IJ SOLN
INTRAMUSCULAR | Status: AC
  Filled 2017-06-30: qty 1

## 2017-06-30 MED ORDER — SUCCINYLCHOLINE CHLORIDE 20 MG/ML IJ SOLN
INTRAMUSCULAR | Status: DC | PRN
  Administered 2017-06-30: 120 mg via INTRAVENOUS

## 2017-06-30 SURGICAL SUPPLY — 28 items
BANDAGE COBAN STERILE 2 (GAUZE/BANDAGES/DRESSINGS) IMPLANT
CANISTER SUCT 1200ML W/VALVE (MISCELLANEOUS) ×3 IMPLANT
CATH ROBINSON RED A/P 12FR (CATHETERS) IMPLANT
COAGULATOR SUCT 6 FR SWTCH (ELECTROSURGICAL)
COAGULATOR SUCT SWTCH 10FR 6 (ELECTROSURGICAL) IMPLANT
COVER BACK TABLE 60X90IN (DRAPES) ×3 IMPLANT
COVER MAYO STAND STRL (DRAPES) ×3 IMPLANT
ELECT COATED BLADE 2.86 ST (ELECTRODE) ×3 IMPLANT
ELECT REM PT RETURN 9FT ADLT (ELECTROSURGICAL)
ELECT REM PT RETURN 9FT PED (ELECTROSURGICAL)
ELECTRODE REM PT RETRN 9FT PED (ELECTROSURGICAL) IMPLANT
ELECTRODE REM PT RTRN 9FT ADLT (ELECTROSURGICAL) IMPLANT
GAUZE SPONGE 4X4 12PLY STRL LF (GAUZE/BANDAGES/DRESSINGS) ×3 IMPLANT
GLOVE SS BIOGEL STRL SZ 7.5 (GLOVE) ×1 IMPLANT
GLOVE SUPERSENSE BIOGEL SZ 7.5 (GLOVE) ×2
GOWN STRL REUS W/ TWL LRG LVL3 (GOWN DISPOSABLE) ×1 IMPLANT
GOWN STRL REUS W/TWL LRG LVL3 (GOWN DISPOSABLE) ×3
MARKER SKIN DUAL TIP RULER LAB (MISCELLANEOUS) IMPLANT
NS IRRIG 1000ML POUR BTL (IV SOLUTION) ×3 IMPLANT
PENCIL FOOT CONTROL (ELECTRODE) ×3 IMPLANT
SHEET MEDIUM DRAPE 40X70 STRL (DRAPES) ×3 IMPLANT
SOLUTION BUTLER CLEAR DIP (MISCELLANEOUS) IMPLANT
SPONGE TONSIL 1 RF SGL (DISPOSABLE) IMPLANT
SPONGE TONSIL TAPE 1.25 RFD (DISPOSABLE) ×2 IMPLANT
SYR BULB 3OZ (MISCELLANEOUS) ×1 IMPLANT
TOWEL GREEN STERILE FF (TOWEL DISPOSABLE) ×3 IMPLANT
TUBE CONNECTING 20'X1/4 (TUBING) ×1
TUBE CONNECTING 20X1/4 (TUBING) ×2 IMPLANT

## 2017-06-30 NOTE — Anesthesia Postprocedure Evaluation (Signed)
Anesthesia Post Note  Patient: Mary Richardson  Procedure(s) Performed: TONSILLECTOMY (N/A Throat)     Patient location during evaluation: PACU Anesthesia Type: General Level of consciousness: awake and alert Pain management: pain level controlled Vital Signs Assessment: post-procedure vital signs reviewed and stable Respiratory status: spontaneous breathing, nonlabored ventilation and respiratory function stable Cardiovascular status: blood pressure returned to baseline and stable Postop Assessment: no apparent nausea or vomiting Anesthetic complications: no    Last Vitals:  Vitals:   06/30/17 0915 06/30/17 0931  BP: 125/82 (!) 142/81  Pulse: (!) 59 (!) 59  Resp: 14 16  Temp:  36.4 C  SpO2: 96% 95%    Last Pain:  Vitals:   06/30/17 0931  TempSrc:   PainSc: 5                  Beryle Lathehomas E Victoriano Campion

## 2017-06-30 NOTE — Discharge Instructions (Addendum)
Post Anesthesia Home Care Instructions  Activity: Get plenty of rest for the remainder of the day. A responsible individual must stay with you for 24 hours following the procedure.  For the next 24 hours, DO NOT: -Drive a car -Advertising copywriterperate machinery -Drink alcoholic beverages -Take any medication unless instructed by your physician -Make any legal decisions or sign important papers.  Meals: Start with liquid foods such as gelatin or soup. Progress to regular foods as tolerated. Avoid greasy, spicy, heavy foods. If nausea and/or vomiting occur, drink only clear liquids until the nausea and/or vomiting subsides. Call your physician if vomiting continues.  Special Instructions/Symptoms: Your throat may feel dry or sore from the anesthesia or the breathing tube placed in your throat during surgery. If this causes discomfort, gargle with warm salt water. The discomfort should disappear within 24 hours.  If you had a scopolamine patch placed behind your ear for the management of post- operative nausea and/or vomiting:  1. The medication in the patch is effective for 72 hours, after which it should be removed.  Wrap patch in a tissue and discard in the trash. Wash hands thoroughly with soap and water. 2. You may remove the patch earlier than 72 hours if you experience unpleasant side effects which may include dry mouth, dizziness or visual disturbances. 3. Avoid touching the patch. Wash your hands with soap and water after contact with the patch.   Instructions for Home Care After Tonsillectomy  First Day Home: Encourage fluid intake by frequently offering liquids, soup, ice cream jello, etc.  Drink several glasses of water.  Cooler fluids are best.  Avoid hot and highly seasoned foods.  Orange juice, grapefruit juice and tomato juice may cause stinging sensation because of their acidic content.    Second and Third Day Home: Continue liquids and add soft foods, (pudding, macaroni and cheese,  mashed potatoes, soft scrambled eggs, etc.).  Make sure you drink plenty of liquids so you do not get dehydrated.  Fifth Thru Seventh Day Home: Gradually resume a normal diet, but avoid hot foods, potato chips, nuts, toast and crackers until 2 weeks after surgery.  General Instructions   No undue physical exertion or exercise for one week.  Children: Tylenol may be used for discomfort and/or fever.  Use as often as necessary within limits of the directions.  Adults: May spray throat with Chloroseptic or other topical anesthetic for discomfort and use pain medication obtained by prescription as directed.    A slight fever (up to 101) is expected for the first the first couple of days.  Take Tylenol (or aspirin substitute) as directed.  Pain in ears is common after tonsillectomy.  It represents pain referred from the throat where the tonsils were removed.  There is usually nothing wrong with the ears in most cases.  Administer Tylenol as needed to control this pain.  White patches will form where the tonsils were removed.  This is perfectly normal.  They will disappear in one to two weeks.  Mouth odor may be notice during the healing sta  In a very small percentage of people, there is some bleeding after five to six days.  If this happens, do not become excited, for the bleeding is usually light.  Be quiet, lie down, and spit the blood out gently.  Gargle the throat with ice water.  If the bleeding does not stop promptly, call the office [phone]), which answers 24 hours a day.  A follow up appointment should be  made with Dr. Ezzard Standing 10-14 days following surgery. Please call [phone] for the appointment time.  Take your regular medications.  Also start amoxicillin susp 500 mg twice per day tonight Take tylenol, ibuprofen and or hydrocodone elixir 10-15 cc every 6 hrs prn pain

## 2017-06-30 NOTE — Transfer of Care (Signed)
Immediate Anesthesia Transfer of Care Note  Patient: Mary Richardson  Procedure(s) Performed: TONSILLECTOMY (N/A Throat)  Patient Location: PACU  Anesthesia Type:General  Level of Consciousness: awake, alert  and drowsy  Airway & Oxygen Therapy: Patient Spontanous Breathing and Patient connected to face mask oxygen  Post-op Assessment: Report given to RN and Post -op Vital signs reviewed and stable  Post vital signs: Reviewed and stable  Last Vitals:  Vitals Value Taken Time  BP 157/104 06/30/2017  8:26 AM  Temp 36.5 C 06/30/2017  8:26 AM  Pulse 89 06/30/2017  8:28 AM  Resp 26 06/30/2017  8:28 AM  SpO2 100 % 06/30/2017  8:28 AM  Vitals shown include unvalidated device data.  Last Pain:  Vitals:   06/30/17 0620  TempSrc: Oral         Complications: No apparent anesthesia complications

## 2017-06-30 NOTE — Brief Op Note (Signed)
06/30/2017  8:12 AM  PATIENT:  Mary Richardson  47 y.o. female  PRE-OPERATIVE DIAGNOSIS:  CHRONIC TONSILITIS  POST-OPERATIVE DIAGNOSIS:  CHRONIC TONSILITIS  PROCEDURE:  Procedure(s): TONSILLECTOMY (N/A)  SURGEON:  Surgeon(s) and Role:    * Drema HalonNewman, Christopher E, MD - Primary  PHYSICIAN ASSISTANT:   ASSISTANTS: none   ANESTHESIA:   general  EBL:  minimal BLOOD ADMINISTERED:none  DRAINS: none   LOCAL MEDICATIONS USED:  NONE  SPECIMEN:  No Specimen  DISPOSITION OF SPECIMEN:  N/A  COUNTS:  YES  TOURNIQUET:  * No tourniquets in log *  DICTATION: .Other Dictation: Dictation Number [id number]  PLAN OF CARE: Discharge to home after PACU  PATIENT DISPOSITION:  PACU - hemodynamically stable.   Delay start of Pharmacological VTE agent (>24hrs) due to surgical blood loss or risk of bleeding: yes

## 2017-06-30 NOTE — Interval H&P Note (Signed)
History and Physical Interval Note:  06/30/2017 7:31 AM  Mary Richardson  has presented today for surgery, with the diagnosis of CHRONIC TONSILITIS  The various methods of treatment have been discussed with the patient and family. After consideration of risks, benefits and other options for treatment, the patient has consented to  Procedure(s): TONSILLECTOMY (N/A) as a surgical intervention .  The patient's history has been reviewed, patient examined, no change in status, stable for surgery.  I have reviewed the patient's chart and labs.  Questions were answered to the patient's satisfaction.     Dillard Cannonhristopher Ishi Danser

## 2017-06-30 NOTE — Op Note (Signed)
[name]: Richardson, Mary MEDICAL RECORD [id number] ACCOUNT [id number] DATE OF BIRTH:05-26-1970 FACILITY: [facility] LOCATION: [facility] PHYSICIAN:Elva Breaker Braxton FeathersE. Joran Kallal, MD  OPERATIVE REPORT  DATE OF PROCEDURE:  06/30/2017  PREOPERATIVE DIAGNOSIS:  Recurrent tonsillitis with tonsillar hypertrophy.  POSTOPERATIVE DIAGNOSIS:  Recurrent tonsillitis with tonsillar hypertrophy.  OPERATION:  Tonsillectomy.  ASSISTANT:  Dillard Cannonhristopher Tyon Cerasoli, MD  ANESTHESIA:  General endotracheal.  ESTIMATED BLOOD LOSS:  Minimal.  COMPLICATIONS:  None.  BRIEF CLINICAL NOTE:  The patient is a 47 year old female who has had numerous tonsil infections these past 2 years.  She has had positive strep tests and has been on several rounds of antibiotics.  On exam, she has large 3+ bilateral tonsils.  She is  taken to the operating room at this time for a tonsillectomy.  DESCRIPTION OF PROCEDURE:  After adequate endotracheal anesthesia, a mouth gag was used to expose the oropharynx.  The left and right tonsils were resected from the tonsillar fossa using the cautery.  Hemostasis was obtained with the cautery.  Tonsils  were removed with minimal bleeding.  Following removal of the tonsils, the uvula was very elongated and slightly edematous at its end and the distal 1/3 of the uvula was amputated with the cautery with no bleeding.  The nasopharynx was examined and she  had no significant adenoid tissue.  This completed the procedure.  Oropharynx was irrigated with saline.  Mouth gag was removed and the patient was awoken from anesthesia.  DISPOSITION:  She is discharged home later this morning on amoxicillin suspension 500 mg b.i.d. for [redacted] week along with Tylenol, ibuprofen and/or hydrocodone elixir 2-3 teaspoons q.4 hours p.r.n. pain.  She will follow up in my office in 10-14 days for  recheck.  TN/NUANCE  D:06/30/2017 T:06/30/2017 JOB:[id number]

## 2017-06-30 NOTE — Anesthesia Procedure Notes (Signed)
Procedure Name: Intubation Date/Time: 06/30/2017 7:42 AM Performed by: Willa Frater, CRNA Pre-anesthesia Checklist: Patient identified, Emergency Drugs available, Suction available and Patient being monitored Patient Re-evaluated:Patient Re-evaluated prior to induction Oxygen Delivery Method: Circle system utilized Preoxygenation: Pre-oxygenation with 100% oxygen Induction Type: IV induction Ventilation: Mask ventilation without difficulty Laryngoscope Size: Mac and 3 Grade View: Grade II Tube type: Oral Tube size: 7.0 mm Number of attempts: 1 Airway Equipment and Method: Stylet and Oral airway Placement Confirmation: ETT inserted through vocal cords under direct vision,  positive ETCO2 and breath sounds checked- equal and bilateral Secured at: 24 cm Tube secured with: Tape Dental Injury: Teeth and Oropharynx as per pre-operative assessment  Difficulty Due To: Difficulty was anticipated, Difficult Airway- due to large tongue and Difficult Airway- due to anterior larynx

## 2017-07-01 ENCOUNTER — Encounter (HOSPITAL_BASED_OUTPATIENT_CLINIC_OR_DEPARTMENT_OTHER): Payer: Self-pay | Admitting: Otolaryngology

## 2017-07-04 ENCOUNTER — Encounter (HOSPITAL_BASED_OUTPATIENT_CLINIC_OR_DEPARTMENT_OTHER): Payer: Self-pay | Admitting: Emergency Medicine

## 2017-07-04 ENCOUNTER — Other Ambulatory Visit: Payer: Self-pay

## 2017-07-04 ENCOUNTER — Emergency Department (HOSPITAL_BASED_OUTPATIENT_CLINIC_OR_DEPARTMENT_OTHER)
Admission: EM | Admit: 2017-07-04 | Discharge: 2017-07-05 | Disposition: A | Discharge status: Home / Self Care | Payer: BLUE CROSS/BLUE SHIELD | Attending: Emergency Medicine | Admitting: Emergency Medicine

## 2017-07-04 ENCOUNTER — Emergency Department (HOSPITAL_BASED_OUTPATIENT_CLINIC_OR_DEPARTMENT_OTHER): Payer: BLUE CROSS/BLUE SHIELD

## 2017-07-04 DIAGNOSIS — I10 Essential (primary) hypertension: Secondary | ICD-10-CM | POA: Diagnosis not present

## 2017-07-04 DIAGNOSIS — J45909 Unspecified asthma, uncomplicated: Secondary | ICD-10-CM | POA: Insufficient documentation

## 2017-07-04 DIAGNOSIS — G8918 Other acute postprocedural pain: Secondary | ICD-10-CM | POA: Diagnosis not present

## 2017-07-04 DIAGNOSIS — Z79899 Other long term (current) drug therapy: Secondary | ICD-10-CM | POA: Insufficient documentation

## 2017-07-04 DIAGNOSIS — M549 Dorsalgia, unspecified: Secondary | ICD-10-CM

## 2017-07-04 DIAGNOSIS — N83202 Unspecified ovarian cyst, left side: Secondary | ICD-10-CM | POA: Diagnosis not present

## 2017-07-04 DIAGNOSIS — Z9089 Acquired absence of other organs: Secondary | ICD-10-CM

## 2017-07-04 DIAGNOSIS — M5442 Lumbago with sciatica, left side: Secondary | ICD-10-CM | POA: Insufficient documentation

## 2017-07-04 DIAGNOSIS — E119 Type 2 diabetes mellitus without complications: Secondary | ICD-10-CM | POA: Diagnosis not present

## 2017-07-04 DIAGNOSIS — N83201 Unspecified ovarian cyst, right side: Secondary | ICD-10-CM

## 2017-07-04 LAB — CBC WITH DIFFERENTIAL/PLATELET
Basophils Absolute: 0 10*3/uL (ref 0.0–0.1)
Basophils Relative: 1 %
EOS PCT: 1 %
Eosinophils Absolute: 0.1 10*3/uL (ref 0.0–0.7)
HEMATOCRIT: 33.7 % — AB (ref 36.0–46.0)
HEMOGLOBIN: 10.9 g/dL — AB (ref 12.0–15.0)
LYMPHS ABS: 1.6 10*3/uL (ref 0.7–4.0)
LYMPHS PCT: 23 %
MCH: 25.3 pg — AB (ref 26.0–34.0)
MCHC: 32.3 g/dL (ref 30.0–36.0)
MCV: 78.2 fL (ref 78.0–100.0)
Monocytes Absolute: 0.6 10*3/uL (ref 0.1–1.0)
Monocytes Relative: 9 %
NEUTROS ABS: 4.7 10*3/uL (ref 1.7–7.7)
NEUTROS PCT: 66 %
Platelets: 314 10*3/uL (ref 150–400)
RBC: 4.31 MIL/uL (ref 3.87–5.11)
RDW: 16.6 % — ABNORMAL HIGH (ref 11.5–15.5)
WBC: 7 10*3/uL (ref 4.0–10.5)

## 2017-07-04 LAB — URINALYSIS, ROUTINE W REFLEX MICROSCOPIC
GLUCOSE, UA: NEGATIVE mg/dL
Hgb urine dipstick: NEGATIVE
Ketones, ur: 15 mg/dL — AB
Leukocytes, UA: NEGATIVE
NITRITE: NEGATIVE
PH: 6 (ref 5.0–8.0)
Protein, ur: NEGATIVE mg/dL

## 2017-07-04 LAB — COMPREHENSIVE METABOLIC PANEL
ALK PHOS: 71 U/L (ref 38–126)
ALT: 19 U/L (ref 14–54)
AST: 19 U/L (ref 15–41)
Albumin: 3.6 g/dL (ref 3.5–5.0)
Anion gap: 8 (ref 5–15)
BILIRUBIN TOTAL: 0.6 mg/dL (ref 0.3–1.2)
BUN: 9 mg/dL (ref 6–20)
CALCIUM: 9.1 mg/dL (ref 8.9–10.3)
CO2: 28 mmol/L (ref 22–32)
Chloride: 103 mmol/L (ref 101–111)
Creatinine, Ser: 0.69 mg/dL (ref 0.44–1.00)
Glucose, Bld: 88 mg/dL (ref 65–99)
Potassium: 3.1 mmol/L — ABNORMAL LOW (ref 3.5–5.1)
Sodium: 139 mmol/L (ref 135–145)
Total Protein: 8.5 g/dL — ABNORMAL HIGH (ref 6.5–8.1)

## 2017-07-04 LAB — PREGNANCY, URINE: Preg Test, Ur: NEGATIVE

## 2017-07-04 LAB — LIPASE, BLOOD: LIPASE: 21 U/L (ref 11–51)

## 2017-07-04 MED ORDER — MORPHINE SULFATE (PF) 4 MG/ML IV SOLN
4.0000 mg | Freq: Once | INTRAVENOUS | Status: AC
  Administered 2017-07-04: 4 mg via INTRAVENOUS
  Filled 2017-07-04: qty 1

## 2017-07-04 MED ORDER — SODIUM CHLORIDE 0.9 % IV BOLUS
1000.0000 mL | Freq: Once | INTRAVENOUS | Status: AC
  Administered 2017-07-04: 1000 mL via INTRAVENOUS

## 2017-07-04 MED ORDER — PREDNISONE 50 MG PO TABS
60.0000 mg | ORAL_TABLET | Freq: Once | ORAL | Status: AC
  Administered 2017-07-04: 60 mg via ORAL
  Filled 2017-07-04: qty 1

## 2017-07-04 MED ORDER — GABAPENTIN 300 MG PO CAPS: 300.0000 mg | ORAL_CAPSULE | Freq: Two times a day (BID) | ORAL | 0 refills | Status: DC

## 2017-07-04 MED ORDER — GI COCKTAIL ~~LOC~~
30.0000 mL | Freq: Once | ORAL | Status: AC
  Administered 2017-07-04: 30 mL via ORAL
  Filled 2017-07-04: qty 30

## 2017-07-04 MED ORDER — PREDNISONE 20 MG PO TABS: ORAL_TABLET | ORAL | 0 refills | Status: DC

## 2017-07-04 NOTE — ED Triage Notes (Signed)
Patient states that she had a tonsilectomy 4 days ago. She continues to have pain with swallowing but reports that she now has pain when she sits down to her lower back

## 2017-07-04 NOTE — Discharge Instructions (Signed)
Continue taking your vicodin as prescribed by ENT doctor.   Add prednisone to help with your back and throat swelling.   Add gabapentin for your back pain.   You have a cyst in your right ovary and likely had a ruptured cyst causing your abdominal pain. The cyst needs to be followed up with GYN doctor  See your primary care doctor  Return to ER if you have worse abdominal pain, back pain, trouble swallowing, dehydration, fever

## 2017-07-04 NOTE — ED Provider Notes (Signed)
[facility] EMERGENCY DEPARTMENT Provider Note   CSN: [id number] Arrival date & time: 07/04/17  1746     History   Chief Complaint Chief Complaint  Patient presents with  . Post-op Problem    HPI Mary Richardson is a 47 y.o. female hx of HTN, migraines, here with back pain, sore throat. Patient is POD # 5 s/p tonsillectomy by Dr. Ezzard Standing. She states that she has persistent poor appetite and sore throat but denies any bleeding from the tonsillectomy site. She has progressively worsen back and flank pain for the last several days. She denies any fall or injury. Denies any numbness or weakness of her legs. She states that she was told that she has cysts in her kidneys before.   The history is provided by the patient.    Past Medical History:  Diagnosis Date  . Anemia   . Asthma    no issues as adult  . Headache(784.0)   . Hepatitis B   . Hypertension    during pregnancy  . Migraine   . Tonsillitis, chronic     Patient Active Problem List   Diagnosis Date Noted  . Bilateral carpal tunnel syndrome 04/19/2017  . Trigger finger, acquired 02/06/2017  . Left knee pain 12/06/2016  . Vitamin D deficiency 03/13/2016  . Normochromic normocytic anemia 03/13/2016  . Benign essential hypertension 03/13/2016  . Type 2 diabetes mellitus without complication, without long-term current use of insulin (HCC) 08/28/2015  . Hyperlipidemia 08/28/2015  . Non morbid obesity due to excess calories 02/19/2015  . Gestational diabetes mellitus in childbirth 01/11/2013    Past Surgical History:  Procedure Laterality Date  . NO PAST SURGERIES    . TONSILLECTOMY N/A 06/30/2017   Procedure: TONSILLECTOMY;  Surgeon: Drema Halon, MD;  Location: [facility];  Service: ENT;  Laterality: N/A;     OB History    Gravida  2   Para  2   Term  1   Preterm  1   AB      Living  2     SAB      TAB      Ectopic      Multiple      Live Births  2             Home Medications    Prior to Admission medications   Medication Sig Start Date End Date Taking? Authorizing Provider  amoxicillin (AMOXIL) 250 MG/5ML suspension Take 10 mLs (500 mg total) by mouth 2 (two) times daily. 06/30/17   Drema Halon, MD  celecoxib (CELEBREX) 100 MG capsule Take 100 mg by mouth 2 (two) times daily.    [provider]  ferrous sulfate 325 (65 FE) MG tablet Take 1 tablet (325 mg total) by mouth daily with breakfast. TAKE WITH ORANGE JUICE 05/23/17   [address], [place], [place]  HYDROcodone-acetaminophen (HYCET) 7.5-325 mg/15 ml solution Take 10-15 mLs by mouth every 6 (six) hours as needed for moderate pain. 06/30/17 06/30/18  Drema Halon, MD    Family History Family History  Problem Relation Age of Onset  . Diabetes Father   . Hypertension Father   . Stroke Father   . Asthma Brother   . Asthma Son   . Hearing loss Maternal Aunt   . Alcohol abuse Neg Hx   . Arthritis Neg Hx   . Birth defects Neg Hx   . Cancer Neg Hx   . COPD Neg Hx   .  Depression Neg Hx   . Drug abuse Neg Hx   . Early death Neg Hx   . Heart disease Neg Hx   . Hyperlipidemia Neg Hx   . Kidney disease Neg Hx   . Learning disabilities Neg Hx   . Mental illness Neg Hx   . Mental retardation Neg Hx   . Miscarriages / Stillbirths Neg Hx   . Vision loss Neg Hx     Social History Social History   Tobacco Use  . Smoking status: Never Smoker  . Smokeless tobacco: Never Used  Substance Use Topics  . Alcohol use: No  . Drug use: No     Allergies   Patient has no known allergies.   Review of Systems Review of Systems  HENT: Positive for sore throat.   Musculoskeletal: Positive for back pain.  All other systems reviewed and are negative.    Physical Exam Updated Vital Signs BP (!) 147/91 (BP Location: Left Arm)   Pulse 83   Temp 99.9 F (37.7 C) (Oral)   Resp 16   Ht 5\' 6"  (1.676 m)   Wt 97.5 kg (215 lb)   LMP 06/17/2017   SpO2 100%   BMI  34.70 kg/m   Physical Exam  Constitutional: She is oriented to person, place, and time.  Uncomfortable  HENT:  Head: Normocephalic.  Tonsillectomy site with eschar. No bleeding   Eyes: Pupils are equal, round, and reactive to light. Conjunctivae and EOM are normal.  Neck: Normal range of motion. Neck supple.  Cardiovascular: Normal rate, regular rhythm and normal heart sounds.  Pulmonary/Chest: Effort normal and breath sounds normal. No stridor. No respiratory distress. She has no wheezes.  Abdominal: Soft. Bowel sounds are normal.  Mild epigastric and L CVAT   Musculoskeletal:  Mild L paralumbar tenderness, no rebound. No saddle anesthesia   Neurological: She is alert and oriented to person, place, and time. No cranial nerve deficit. Coordination normal.  CN 2- 12 intact. Nl strength and sensation bilateral lower extremities   Skin: Skin is warm.  Psychiatric: She has a normal mood and affect.  Nursing note and vitals reviewed.    ED Treatments / Results  Labs (all labs ordered are listed, but only abnormal results are displayed) Labs Reviewed  CBC WITH DIFFERENTIAL/PLATELET - Abnormal; Notable for the following components:      Result Value   Hemoglobin 10.9 (*)    HCT 33.7 (*)    MCH 25.3 (*)    RDW 16.6 (*)    All other components within normal limits  COMPREHENSIVE METABOLIC PANEL - Abnormal; Notable for the following components:   Potassium 3.1 (*)    Total Protein 8.5 (*)    All other components within normal limits  URINALYSIS, ROUTINE W REFLEX MICROSCOPIC - Abnormal; Notable for the following components:   Specific Gravity, Urine >1.030 (*)    Bilirubin Urine SMALL (*)    Ketones, ur 15 (*)    All other components within normal limits  LIPASE, BLOOD  PREGNANCY, URINE    EKG None  Radiology Ct L-spine No Charge  Result Date: 07/04/2017 CLINICAL DATA:  Initial evaluation for acute lower back pain, history of renal cyst. Recent tonsillectomy. EXAM: CT  ABDOMEN AND PELVIS WITHOUT CONTRAST CT LUMBAR SPINE WITHOUT CONTRAST TECHNIQUE: Multidetector CT imaging of the abdomen and pelvis was performed following the standard protocol without IV contrast. COMPARISON:  Prior ultrasound from 05/23/2017. FINDINGS: CT ABDOMEN AND PELVIS FINDINGS Lower chest: Mild scattered atelectatic  changes present within the visualized lung bases. Visualized lungs are otherwise clear. Hepatobiliary: Limited noncontrast evaluation of the liver is unremarkable. Vague hyperdensity layering within the gallbladder lumen suggestive of sludge. No imaging findings to suggest acute cholecystitis. No biliary dilatation. Pancreas: Pancreas within normal limits. Spleen: Spleen within normal limits. Adrenals/Urinary Tract: Adrenal glands are normal. Kidneys equal in size without evidence for nephrolithiasis or hydronephrosis. No radiopaque calculi seen along the course of either renal collecting system. No hydroureter. 14 mm right renal cyst increased in size relative to most recent available CT from 2008, but similar relative to previous ultrasound. Similarly, 2.2 cm left renal cyst also slightly increased in size from prior CT, but similar relative to recent ultrasound. Partially distended bladder within normal limits. No layering stones within the bladder lumen. Stomach/Bowel: Stomach within normal limits. No evidence for bowel obstruction. No acute inflammatory changes seen about the bowels. Appendix within normal limits. Vascular/Lymphatic: Intra-abdominal aorta of normal caliber. Flattening of the IVC, suggesting dehydration/hypovolemia. No pathologically enlarged intra-abdominal or pelvic lymph nodes. Mildly prominent pelvic sidewall nodes measure at the upper limits of normal in 1 cm in short axis. Reproductive: Probable 13 mm exophytic fibroid seen extending from the posterior aspect of the uterus. Uterus otherwise unremarkable. Left ovary within normal limits. Mildly complex right adnexal  cystic lesion measures 4.0 x 3.5 cm (series 2, image 76), indeterminate, but could reflect ovarian cyst and/or hydrosalpinx. Other: No free intraperitoneal air. Small volume free fluid within the pelvis, presumably physiologic. Musculoskeletal: No acute osseous abnormality. No worrisome lytic or blastic osseous lesions. CT LUMBAR SPINE FINDINGS Normal segmentation. Lowest well-formed disc labeled the L5-S1 level. Trace retrolisthesis of L5 on S1. Vertebral bodies otherwise normally aligned with preservation of the normal lumbar lordosis. No listhesis. Vertebral body heights maintained without evidence for acute or chronic fracture. No discrete lytic or blastic osseous lesions. Visualized sacrum and pelvis intact. SI joints approximated and symmetric. Paraspinous soft tissues demonstrate no acute finding. Disc levels: L1-2: Unremarkable. L2-3: Unremarkable. L3-4: Minimal disc bulge.  Mild facet hypertrophy.  No stenosis. L4-5: Mild diffuse disc bulge. Moderate right with mild left facet hypertrophy. Mild right L4 foraminal narrowing. No significant spinal stenosis. L5-S1: Shallow posterior disc bulge. Moderate bilateral facet hypertrophy. No significant stenosis. IMPRESSION: CT ABDOMEN AND PELVIS IMPRESSION 1. No CT evidence for nephrolithiasis or obstructive uropathy. 2. 4 cm mildly complex right adnexal cystic lesion, which may reflect an ovarian cyst and/or hydrosalpinx. Further assessment with dedicated pelvic ultrasound suggested. 3. Small volume free fluid within the pelvis, presumably physiologic. 4. Bilateral renal cysts as above. 5. No other acute intra-abdominal or pelvic process. CT LUMBAR SPINE IMPRESSION 1. No acute abnormality within the lumbar spine. 2. Mild disc bulging at L3-4 through L5-S1 without significant spinal stenosis. 3. Moderate facet hypertrophy at L4-5 and L5-S1, which could serve as a source for lower back pain. Electronically Signed   By: Rise MuBenjamin  McClintock M.D.   On: 07/04/2017 23:18    Ct Renal Stone Study  Result Date: 07/04/2017 CLINICAL DATA:  Initial evaluation for acute lower back pain, history of renal cyst. Recent tonsillectomy. EXAM: CT ABDOMEN AND PELVIS WITHOUT CONTRAST CT LUMBAR SPINE WITHOUT CONTRAST TECHNIQUE: Multidetector CT imaging of the abdomen and pelvis was performed following the standard protocol without IV contrast. COMPARISON:  Prior ultrasound from 05/23/2017. FINDINGS: CT ABDOMEN AND PELVIS FINDINGS Lower chest: Mild scattered atelectatic changes present within the visualized lung bases. Visualized lungs are otherwise clear. Hepatobiliary: Limited noncontrast evaluation of the liver is  unremarkable. Vague hyperdensity layering within the gallbladder lumen suggestive of sludge. No imaging findings to suggest acute cholecystitis. No biliary dilatation. Pancreas: Pancreas within normal limits. Spleen: Spleen within normal limits. Adrenals/Urinary Tract: Adrenal glands are normal. Kidneys equal in size without evidence for nephrolithiasis or hydronephrosis. No radiopaque calculi seen along the course of either renal collecting system. No hydroureter. 14 mm right renal cyst increased in size relative to most recent available CT from 2008, but similar relative to previous ultrasound. Similarly, 2.2 cm left renal cyst also slightly increased in size from prior CT, but similar relative to recent ultrasound. Partially distended bladder within normal limits. No layering stones within the bladder lumen. Stomach/Bowel: Stomach within normal limits. No evidence for bowel obstruction. No acute inflammatory changes seen about the bowels. Appendix within normal limits. Vascular/Lymphatic: Intra-abdominal aorta of normal caliber. Flattening of the IVC, suggesting dehydration/hypovolemia. No pathologically enlarged intra-abdominal or pelvic lymph nodes. Mildly prominent pelvic sidewall nodes measure at the upper limits of normal in 1 cm in short axis. Reproductive: Probable 13 mm  exophytic fibroid seen extending from the posterior aspect of the uterus. Uterus otherwise unremarkable. Left ovary within normal limits. Mildly complex right adnexal cystic lesion measures 4.0 x 3.5 cm (series 2, image 76), indeterminate, but could reflect ovarian cyst and/or hydrosalpinx. Other: No free intraperitoneal air. Small volume free fluid within the pelvis, presumably physiologic. Musculoskeletal: No acute osseous abnormality. No worrisome lytic or blastic osseous lesions. CT LUMBAR SPINE FINDINGS Normal segmentation. Lowest well-formed disc labeled the L5-S1 level. Trace retrolisthesis of L5 on S1. Vertebral bodies otherwise normally aligned with preservation of the normal lumbar lordosis. No listhesis. Vertebral body heights maintained without evidence for acute or chronic fracture. No discrete lytic or blastic osseous lesions. Visualized sacrum and pelvis intact. SI joints approximated and symmetric. Paraspinous soft tissues demonstrate no acute finding. Disc levels: L1-2: Unremarkable. L2-3: Unremarkable. L3-4: Minimal disc bulge.  Mild facet hypertrophy.  No stenosis. L4-5: Mild diffuse disc bulge. Moderate right with mild left facet hypertrophy. Mild right L4 foraminal narrowing. No significant spinal stenosis. L5-S1: Shallow posterior disc bulge. Moderate bilateral facet hypertrophy. No significant stenosis. IMPRESSION: CT ABDOMEN AND PELVIS IMPRESSION 1. No CT evidence for nephrolithiasis or obstructive uropathy. 2. 4 cm mildly complex right adnexal cystic lesion, which may reflect an ovarian cyst and/or hydrosalpinx. Further assessment with dedicated pelvic ultrasound suggested. 3. Small volume free fluid within the pelvis, presumably physiologic. 4. Bilateral renal cysts as above. 5. No other acute intra-abdominal or pelvic process. CT LUMBAR SPINE IMPRESSION 1. No acute abnormality within the lumbar spine. 2. Mild disc bulging at L3-4 through L5-S1 without significant spinal stenosis. 3.  Moderate facet hypertrophy at L4-5 and L5-S1, which could serve as a source for lower back pain. Electronically Signed   By: Rise Mu M.D.   On: 07/04/2017 23:18    Procedures Procedures (including critical care time)  Medications Ordered in ED Medications  morphine 4 MG/ML injection 4 mg (4 mg Intravenous Given 07/04/17 2202)  gi cocktail (Maalox,Lidocaine,Donnatal) (30 mLs Oral Given 07/04/17 2202)  sodium chloride 0.9 % bolus 1,000 mL (1,000 mLs Intravenous New Bag/Given 07/04/17 2205)     Initial Impression / Assessment and Plan / ED Course  I have reviewed the triage vital signs and the nursing notes.  Pertinent labs & imaging results that were available during my care of the patient were reviewed by me and considered in my medical decision making (see chart for details).     [employer]  Howze is a 47 y.o. female here with L flank and back pain, sore throat. She is POD # 5 after tonsillectomy and doesn't appear to have complications from the tonsillectomy. She has new onset flank pain and back pain. I think likely back strain vs pain from her renal cysts. No neuro deficits in lower extremities. Will get labs, CT renal stone, UA.   11:30 PM Labs and UA unremarkable. She has R ovarian cyst and some free fluid. I wonder if she had a ruptured cyst. She has no RLQ tenderness and I doubt torsion. CT lumbar spine showed L4-5 and L5-S1 facet hypertrophy. I think likely mild radiculopathy vs ruptured cyst causing her abdominal pain, back pain. Will give course of steroids, which will help her throat pain and back. She is already on vicodin for pain, will add gabapentin. Will have her follow up with GYN for ovarian cyst and spine doctor for back pain.    Final Clinical Impressions(s) / ED Diagnoses   Final diagnoses:  Back pain    ED Discharge Orders    None       Charlynne Pander, MD 07/04/17 2334

## 2017-07-05 NOTE — ED Notes (Signed)
Alert, NAD, calm, interactive, resps e/u, speaking in clear complete sentences, no dyspnea noted, skin W&D, VSS, pain improved/ decreased, tolerating POs, (denies: sob, nausea, dizziness or visual changes). [facility]

## 2017-07-28 ENCOUNTER — Ambulatory Visit: Payer: BLUE CROSS/BLUE SHIELD | Admitting: Obstetrics and Gynecology

## 2017-07-28 ENCOUNTER — Encounter: Payer: Self-pay | Admitting: Obstetrics and Gynecology

## 2017-07-28 DIAGNOSIS — N83201 Unspecified ovarian cyst, right side: Secondary | ICD-10-CM

## 2017-07-28 DIAGNOSIS — N83209 Unspecified ovarian cyst, unspecified side: Secondary | ICD-10-CM | POA: Insufficient documentation

## 2017-07-28 NOTE — Patient Instructions (Signed)
Ovarian Cyst An ovarian cyst is a fluid-filled sac on an ovary. The ovaries are organs that make eggs in women. Most ovarian cysts go away on their own and are not cancerous (are benign). Some cysts need treatment. Follow these instructions at home:  Take over-the-counter and prescription medicines only as told by your doctor.  Do not drive or use heavy machinery while taking prescription pain medicine.  Get pelvic exams and Pap tests as often as told by your doctor.  Return to your normal activities as told by your doctor. Ask your doctor what activities are safe for you.  Do not use any products that contain nicotine or tobacco, such as cigarettes and e-cigarettes. If you need help quitting, ask your doctor.  Keep all follow-up visits as told by your doctor. This is important. Contact a doctor if:  Your periods are: ? Late. ? Irregular. ? Painful.  Your periods stop.  You have pelvic pain that does not go away.  You have pressure on your bladder.  You have trouble making your bladder empty when you pee (urinate).  You have pain during sex.  You have any of the following in your belly (abdomen): ? A feeling of fullness. ? Pressure. ? Discomfort. ? Pain that does not go away. ? Swelling.  You feel sick most of the time.  You have trouble pooping (have constipation).  You are not as hungry as usual (you lose your appetite).  You get very bad acne.  You start to have more hair on your body and face.  You are gaining weight or losing weight without changing your exercise and eating habits.  You think you may be pregnant. Get help right away if:  You have belly pain that is very bad or gets worse.  You cannot eat or drink without throwing up (vomiting).  You suddenly get a fever.  Your period is a lot heavier than usual. This information is not intended to replace advice given to you by your health care provider. Make sure you discuss any questions you have  with your health care provider. Document Released: 06/18/2007 Document Revised: 07/20/2015 Document Reviewed: 06/03/2015 Elsevier Interactive Patient Education  2018 [employer]  

## 2017-07-28 NOTE — Progress Notes (Signed)
Ms Mary Richardson presents for follow up form ER. She was seen in ER on 07/04/17 for back /side pain. Work up including CT scan demonstrated a right ovarian cyst vs hydrosalpinx plus L4-S1 facet hypertrophy.  She reports regular monthly cycles. LMP 07/16/17. Denies any bowel or bladder dysfunction. She denies any abd/pelvic pain today.   H/O HTN and arthritis  [id number] TSVD x 1 SVD x1 Last pap 1/17 reports normal Last mammogram 2/18 reports normal H/O cervical polyp Not sexual active No contraception and denies GYN surgery  PE AF  VSS Lungs clear Heart RRR Abd soft + BS non tender GU nl EGBUS cervix no lesions, uterus < 10 week size mobile non tender no masses  A/P Right ovarian cyst  Ovarian cysts reviewed with pt. Will check GYN U/S, f/u pending U/S results. More than 50% of appt time spent face to face.

## 2017-08-03 ENCOUNTER — Ambulatory Visit (HOSPITAL_COMMUNITY)
Admission: RE | Admit: 2017-08-03 | Discharge: 2017-08-03 | Disposition: A | Discharge status: Home / Self Care | Payer: BLUE CROSS/BLUE SHIELD | Source: Ambulatory Visit | Attending: Obstetrics and Gynecology | Admitting: Obstetrics and Gynecology

## 2017-08-03 DIAGNOSIS — N83201 Unspecified ovarian cyst, right side: Secondary | ICD-10-CM | POA: Diagnosis not present

## 2017-08-12 ENCOUNTER — Encounter: Payer: Self-pay | Admitting: Obstetrics and Gynecology

## 2017-08-12 ENCOUNTER — Ambulatory Visit (INDEPENDENT_AMBULATORY_CARE_PROVIDER_SITE_OTHER): Payer: BLUE CROSS/BLUE SHIELD | Admitting: Obstetrics and Gynecology

## 2017-08-12 VITALS — BP 130/77 | HR 81 | Wt 216.6 lb

## 2017-08-12 DIAGNOSIS — N83209 Unspecified ovarian cyst, unspecified side: Secondary | ICD-10-CM | POA: Diagnosis not present

## 2017-08-12 NOTE — Progress Notes (Signed)
Ms Mary Richardson presents for GYN U/S follow up d/t ovarian and possible hydrsalpinx. GYN U/S normal, ovarian cyst has resolved and no evidence of hydrosalpinx. Results reviewed with pt. She has no complaints today.   PE AF VSS Lungs clear Heart RRR Abd soft + BS  A/P Ovarian cyst, resolved  Pt will schedule appt for yearly GYN exam.

## 2017-08-12 NOTE — Patient Instructions (Signed)

## 2017-09-07 ENCOUNTER — Ambulatory Visit: Payer: BLUE CROSS/BLUE SHIELD | Admitting: Family Medicine

## 2017-09-07 ENCOUNTER — Encounter: Payer: Self-pay | Admitting: Family Medicine

## 2017-09-07 DIAGNOSIS — M6530 Trigger finger, unspecified finger: Secondary | ICD-10-CM | POA: Diagnosis not present

## 2017-09-07 MED ORDER — METHYLPREDNISOLONE ACETATE 40 MG/ML IJ SUSP
20.0000 mg | Freq: Once | INTRAMUSCULAR | Status: AC
  Administered 2017-09-07: 20 mg via INTRA_ARTICULAR

## 2017-09-07 NOTE — Patient Instructions (Signed)
You have a trigger finger of your thumb. Ibuprofen or aleve if needed for pain and inflammation. You were given an injection for this today. If this comes back despite the repeat injection I'd recommend seeing a hand surgeon for trigger finger release.

## 2017-09-07 NOTE — Progress Notes (Signed)
PCP: System, Pcp Not In  Subjective:   HPI: Patient is a 47 y.o. female here for right trigger thumb.  Patient was seen previously for this back in April and had a steroid injection which lasted her about 4 months.  Her pain and catching really worsened about last week.  She denies any new concerning symptoms.  She reports pain over the palmar aspect of the first MCP.  She reports feeling a slight swelling.  She has pain with flexion of the thumb and notes catching with thumb extension.  She has some baseline numbness and tingling in the median nerve distribution from carpal tunnel syndrome.  No worsening of the symptoms.  She denies any other overlying skin changes.  Past Medical History:  Diagnosis Date  . Anemia   . Asthma    no issues as adult  . Headache(784.0)   . Hepatitis B   . Hypertension    during pregnancy  . Migraine   . Tonsillitis, chronic     Current Outpatient Medications on File Prior to Visit  Medication Sig Dispense Refill  . celecoxib (CELEBREX) 100 MG capsule Take 100 mg by mouth 2 (two) times daily.    . ferrous sulfate 325 (65 FE) MG tablet Take 1 tablet (325 mg total) by mouth daily with breakfast. TAKE WITH ORANGE JUICE 30 tablet 0  . gabapentin (NEURONTIN) 300 MG capsule Take 1 capsule (300 mg total) by mouth 2 (two) times daily. 20 capsule 0   No current facility-administered medications on file prior to visit.     Past Surgical History:  Procedure Laterality Date  . NO PAST SURGERIES    . TONSILLECTOMY N/A 06/30/2017   Procedure: TONSILLECTOMY;  Surgeon: Drema HalonNewman, Christopher E, MD;  Location: [facility];  Service: ENT;  Laterality: N/A;    No Known Allergies  Social History   Socioeconomic History  . Marital status: Single    Spouse name: Not on file  . Number of children: Not on file  . Years of education: Not on file  . Highest education level: Not on file  Occupational History  . Not on file  Social Needs  . Financial  resource strain: Not on file  . Food insecurity:    Worry: Not on file    Inability: Not on file  . Transportation needs:    Medical: Not on file    Non-medical: Not on file  Tobacco Use  . Smoking status: Never Smoker  . Smokeless tobacco: Never Used  Substance and Sexual Activity  . Alcohol use: No  . Drug use: No  . Sexual activity: Not Currently    Birth control/protection: None  Lifestyle  . Physical activity:    Days per week: Not on file    Minutes per session: Not on file  . Stress: Not on file  Relationships  . Social connections:    Talks on phone: Not on file    Gets together: Not on file    Attends religious service: Not on file    Active member of club or organization: Not on file    Attends meetings of clubs or organizations: Not on file    Relationship status: Not on file  . Intimate partner violence:    Fear of current or ex partner: Not on file    Emotionally abused: Not on file    Physically abused: Not on file    Forced sexual activity: Not on file  Other Topics Concern  .  Not on file  Social History Narrative  . Not on file    Family History  Problem Relation Age of Onset  . Diabetes Father   . Hypertension Father   . Stroke Father   . Asthma Brother   . Asthma Son   . Hearing loss Maternal Aunt   . Alcohol abuse Neg Hx   . Arthritis Neg Hx   . Birth defects Neg Hx   . Cancer Neg Hx   . COPD Neg Hx   . Depression Neg Hx   . Drug abuse Neg Hx   . Early death Neg Hx   . Heart disease Neg Hx   . Hyperlipidemia Neg Hx   . Kidney disease Neg Hx   . Learning disabilities Neg Hx   . Mental illness Neg Hx   . Mental retardation Neg Hx   . Miscarriages / Stillbirths Neg Hx   . Vision loss Neg Hx     BP 127/83   Pulse 85   Ht 5\' 6"  (1.676 m)   Wt 210 lb (95.3 kg)   BMI 33.89 kg/m   Review of Systems: See HPI above.     Objective:  Physical Exam:  Gen: awake, alert, NAD, comfortable in exam room Pulm: breathing unlabored  Right  hand: Inspection: No obvious deformity. No swelling, erythema or bruising Palpation: Tenderness over the right A1 pulley of the first digit ROM: Decreased thumb flexion and opposition due to pain Strength: 5/5 strength in the forearm, wrist and interosseus muscles Neurovascular: NV intact Special tests: Negative finkelstein's   Assessment & Plan:  1.  Trigger finger of right thumb -  patient has recurrent triggering of the right thumb for which she had about 4 months of relief from a prior steroid injection.  Steroid injection was repeated today as described below.  She will follow-up as needed, however if the triggering returns will refer her to a hand specialist for evaluation for trigger finger release  After informed written consent timeout was performed and patient was seated on table of exam room.  Area overlying right 1st digit A1 pulley prepped with alcohol swab then injected with 0.5:0.4mL bupivicaine: depomedrol.  Patient tolerated procedure well without immediate complications.

## 2017-09-09 ENCOUNTER — Ambulatory Visit: Payer: BLUE CROSS/BLUE SHIELD | Admitting: Obstetrics and Gynecology

## 2017-11-02 ENCOUNTER — Encounter (HOSPITAL_BASED_OUTPATIENT_CLINIC_OR_DEPARTMENT_OTHER): Payer: Self-pay

## 2017-11-02 ENCOUNTER — Emergency Department (HOSPITAL_BASED_OUTPATIENT_CLINIC_OR_DEPARTMENT_OTHER)
Admission: EM | Admit: 2017-11-02 | Discharge: 2017-11-02 | Disposition: A | Discharge status: Home / Self Care | Payer: BLUE CROSS/BLUE SHIELD | Attending: Emergency Medicine | Admitting: Emergency Medicine

## 2017-11-02 ENCOUNTER — Other Ambulatory Visit: Payer: Self-pay

## 2017-11-02 DIAGNOSIS — H00011 Hordeolum externum right upper eyelid: Secondary | ICD-10-CM | POA: Insufficient documentation

## 2017-11-02 DIAGNOSIS — Z79899 Other long term (current) drug therapy: Secondary | ICD-10-CM | POA: Insufficient documentation

## 2017-11-02 DIAGNOSIS — I10 Essential (primary) hypertension: Secondary | ICD-10-CM | POA: Insufficient documentation

## 2017-11-02 DIAGNOSIS — H5711 Ocular pain, right eye: Secondary | ICD-10-CM | POA: Diagnosis present

## 2017-11-02 DIAGNOSIS — E119 Type 2 diabetes mellitus without complications: Secondary | ICD-10-CM | POA: Insufficient documentation

## 2017-11-02 MED ORDER — CLINDAMYCIN HCL 300 MG PO CAPS
300.0000 mg | ORAL_CAPSULE | Freq: Three times a day (TID) | ORAL | 0 refills | Status: DC
Start: 2017-11-02 — End: 2018-08-16

## 2017-11-02 MED ORDER — FLUORESCEIN SODIUM 1 MG OP STRP
1.0000 | ORAL_STRIP | Freq: Once | OPHTHALMIC | Status: AC
  Administered 2017-11-02: 1 via OPHTHALMIC
  Filled 2017-11-02: qty 1

## 2017-11-02 MED ORDER — TETRACAINE HCL 0.5 % OP SOLN
2.0000 [drp] | Freq: Once | OPHTHALMIC | Status: AC
  Administered 2017-11-02: 2 [drp] via OPHTHALMIC
  Filled 2017-11-02: qty 4

## 2017-11-02 MED FILL — CLINDAMYCIN HCL 300 MG CAP: 300 | 7 days supply | Qty: 21 | Fill #0

## 2017-11-02 NOTE — ED Notes (Signed)
Woods lamp at bedside for provider

## 2017-11-02 NOTE — Discharge Instructions (Addendum)
Have given your antibiotics to take called clindamycin.  Take this as prescribed.  Make sure that you are applying warm compresses to the right eye.  I would take an anti-inflammatory such as Motrin, Aleve or ibuprofen.  Please call the eye doctor today to schedule an appointment.  If your symptoms worsen return to ED immediately.

## 2017-11-02 NOTE — ED Triage Notes (Signed)
Pt rt eye pain with swelling, states dx with stye from UC yesterday given antibiotic cream with no relief

## 2017-11-02 NOTE — ED Notes (Addendum)
Pt states that she took Toradol last night with no relief. SHe also states that she applied warm compress last nightwith no relief. No treatment today. SHe states that did use the prescription this am. Erythromycin cream

## 2017-11-02 NOTE — ED Provider Notes (Signed)
[facility] EMERGENCY DEPARTMENT Provider Note   CSN: [id number] Arrival date & time: 11/02/17  0908     History   Chief Complaint Chief Complaint  Patient presents with  . Eye Pain    HPI Mary Richardson is a 47 y.o. female.  HPI 47 year old African-American female with no pertinent past medical history presents to the ED for evaluation of ongoing redness, swelling and pain to the right eyelid.  Patient states that her symptoms started 3 days ago.  She states that they have progressively worsened.  She reports redness, swelling and pain to the right upper eyelid.  She states that it is tender to touch.  She reports clear drainage and then when she wakes up in the morning she has yellow crusting.  She has tried tramadol for pain last night with little relief.  Patient states that she went to see urgent care yesterday who gave her erythromycin ointment.  She states this has not really helped her symptoms.  She does not report any pain with eye movement but does report pain with touch.  Patient states that due to the discharge she sometimes has blurry vision however she reports normal vision at this time.  She denies any fevers or chills.  She reports some intermittent headaches.  She does not have an eye doctor.  She does not wear contacts.  She does wear glasses at baseline.  Denies any history of reoccurring abscesses.  Denies any fevers or chills.  Denies any injury.  Nothing makes symptoms better. Past Medical History:  Diagnosis Date  . Anemia   . Asthma    no issues as adult  . Headache(784.0)   . Hepatitis B   . Hypertension    during pregnancy  . Migraine   . Tonsillitis, chronic     Patient Active Problem List   Diagnosis Date Noted  . Ovarian cyst 07/28/2017  . Bilateral carpal tunnel syndrome 04/19/2017  . Trigger finger, acquired 02/06/2017  . Left knee pain 12/06/2016  . Vitamin D deficiency 03/13/2016  . Normochromic normocytic anemia 03/13/2016  . Benign  essential hypertension 03/13/2016  . Type 2 diabetes mellitus without complication, without long-term current use of insulin (HCC) 08/28/2015  . Hyperlipidemia 08/28/2015  . Non morbid obesity due to excess calories 02/19/2015  . Gestational diabetes mellitus in childbirth 01/11/2013    Past Surgical History:  Procedure Laterality Date  . NO PAST SURGERIES    . TONSILLECTOMY N/A 06/30/2017   Procedure: TONSILLECTOMY;  Surgeon: Drema Halon, MD;  Location: [facility];  Service: ENT;  Laterality: N/A;     OB History    Gravida  2   Para  2   Term  1   Preterm  1   AB      Living  2     SAB      TAB      Ectopic      Multiple      Live Births  2            Home Medications    Prior to Admission medications   Medication Sig Start Date End Date Taking? Authorizing Provider  celecoxib (CELEBREX) 100 MG capsule Take 100 mg by mouth 2 (two) times daily.    [provider]  ferrous sulfate 325 (65 FE) MG tablet Take 1 tablet (325 mg total) by mouth daily with breakfast. TAKE WITH ORANGE JUICE 05/23/17   Street, [place], [place]  Family History Family History  Problem Relation Age of Onset  . Diabetes Father   . Hypertension Father   . Stroke Father   . Asthma Brother   . Asthma Son   . Hearing loss Maternal Aunt   . Alcohol abuse Neg Hx   . Arthritis Neg Hx   . Birth defects Neg Hx   . Cancer Neg Hx   . COPD Neg Hx   . Depression Neg Hx   . Drug abuse Neg Hx   . Early death Neg Hx   . Heart disease Neg Hx   . Hyperlipidemia Neg Hx   . Kidney disease Neg Hx   . Learning disabilities Neg Hx   . Mental illness Neg Hx   . Mental retardation Neg Hx   . Miscarriages / Stillbirths Neg Hx   . Vision loss Neg Hx     Social History Social History   Tobacco Use  . Smoking status: Never Smoker  . Smokeless tobacco: Never Used  Substance Use Topics  . Alcohol use: No  . Drug use: No     Allergies   Patient has no  known allergies.   Review of Systems Review of Systems  Constitutional: Negative for chills and fever.  HENT: Negative for congestion, rhinorrhea, sinus pressure, sinus pain and sore throat.   Eyes: Positive for pain, discharge and visual disturbance (blurry). Negative for photophobia and redness.  Gastrointestinal: Negative for vomiting.  Musculoskeletal: Negative for myalgias.  Neurological: Positive for headaches. Negative for dizziness, syncope and light-headedness.     Physical Exam Updated Vital Signs BP 122/68 (BP Location: Left Arm)   Pulse 74   Temp 98.2 F (36.8 C) (Oral)   Resp 20   Ht 5\' 5"  (1.651 m)   Wt 100.7 kg   LMP 10/25/2017   SpO2 100%   BMI 36.94 kg/m   Physical Exam  Constitutional: She appears well-developed and well-nourished. No distress.  HENT:  Head: Normocephalic and atraumatic.  Eyes: Pupils are equal, round, and reactive to light. Conjunctivae and EOM are normal. Right eye exhibits discharge and hordeolum. Right eye exhibits no chemosis. Left eye exhibits no discharge. No scleral icterus.  Slit lamp exam:      The right eye shows no corneal abrasion, no corneal ulcer, no foreign body, no hyphema, no fluorescein uptake and no anterior chamber bulge.  EOMs are intact.  No proptosis noted.  There is erythema and edema noted to the right eyelid that is tender to touch and warm.  Patient does have purulent drainage noted.  Adjunctive are normal.  There is no surrounding erythema.  There is no surrounding edema of the orbits.  Pupils are equal round and reactive.  Neck: Normal range of motion. Neck supple.  Pulmonary/Chest: No respiratory distress.  Musculoskeletal: Normal range of motion.  Neurological: She is alert.  Skin: No pallor.  Psychiatric: Her behavior is normal. Judgment and thought content normal.  Nursing note and vitals reviewed.    ED Treatments / Results  Labs (all labs ordered are listed, but only abnormal results are  displayed) Labs Reviewed - No data to display  EKG None  Radiology No results found.  Procedures Procedures (including critical care time)  Medications Ordered in ED Medications  tetracaine (PONTOCAINE) 0.5 % ophthalmic solution 2 drop (2 drops Left Eye Given 11/02/17 1035)  fluorescein ophthalmic strip 1 strip (1 strip Right Eye Given 11/02/17 1036)     Initial Impression / Assessment and Plan /  ED Course  I have reviewed the triage vital signs and the nursing notes.  Pertinent labs & imaging results that were available during my care of the patient were reviewed by me and considered in my medical decision making (see chart for details).     Patient presents the ED for evaluation of right eyelid swelling, erythema and pain.  She also reports discharge.  Patient seen yesterday and diagnosed with a hordeolum.  States that symptoms not improved despite using erythromycin ointment yesterday.  Patient reports pain to the right eyelid.  She reports some intermittent blurry vision secondary to the discharge.  She does not wear contacts.  Denies any systemic symptoms.  On exam patient does have swelling, erythema and drainage to the right eyelid.  Concern for hordeolum or tear duct infection.  Patient has significant swelling noted.  Eye exam performed.  Visual acuity close to patient's baseline.  She does have no fluorescein uptake concerning for corneal ulcer or abrasion.  There is no retained foreign body.  EOMs are intact.  There is no proptosis.  I have low concern for preseptal or periorbital/orbital cellulitis at this time.  Given that the amount of swelling the patient has the right eyelid will put patient on oral antibiotics.  Encouraged warm compresses and have given her close ophthalmology follow-up.  Discussed reasons to return the ED immediately.  Pt is hemodynamically stable, in NAD, & able to ambulate in the ED. Evaluation does not show pathology that would require ongoing  emergent intervention or inpatient treatment. I explained the diagnosis to the patient. Pain has been managed & has no complaints prior to dc. Pt is comfortable with above plan and is stable for discharge at this time. All questions were answered prior to disposition. Strict return precautions for f/u to the ED were discussed. Encouraged follow up with PCP.    Final Clinical Impressions(s) / ED Diagnoses   Final diagnoses:  Hordeolum externum of right upper eyelid    ED Discharge Orders         Ordered    clindamycin (CLEOCIN) 300 MG capsule  3 times daily     11/02/17 1053           Wallace Keller 11/02/17 1102    Little, Ambrose Finland, MD 11/03/17 [phone]

## 2018-02-16 ENCOUNTER — Ambulatory Visit (HOSPITAL_COMMUNITY)
Admission: EM | Admit: 2018-02-16 | Discharge: 2018-02-16 | Disposition: A | Discharge status: Home / Self Care | Payer: BLUE CROSS/BLUE SHIELD | Attending: Internal Medicine | Admitting: Internal Medicine

## 2018-02-16 ENCOUNTER — Encounter (HOSPITAL_COMMUNITY): Payer: Self-pay

## 2018-02-16 DIAGNOSIS — M25511 Pain in right shoulder: Secondary | ICD-10-CM

## 2018-02-16 NOTE — ED Triage Notes (Signed)
Pt presents with complaints of pain on her right arm x 2 months that is worse over the last few days. Patient works for [employer] and uses her arms a lot.

## 2018-02-16 NOTE — ED Provider Notes (Signed)
[facility]    CSN: [id number] Arrival date & time: 02/16/18  1256     History   Chief Complaint Chief Complaint  Patient presents with  . Shoulder Pain    HPI Mary Richardson is a 48 y.o. female with history of asthma comes to the urgent care department on account of right shoulder pain of a few months duration.  Symptoms started insidiously and is gotten progressively worse since her visit to the urgent care department.  Pain is moderately severe at this time, aggravated by movement and relieved by not moving the arm.  Patient denies any neck pain.  No weakness in the right arm.  She has intermittent numbness.  Patient has tried NSAIDs over the past couple of months with no significant improvement HPI  Past Medical History:  Diagnosis Date  . Anemia   . Asthma    no issues as adult  . Headache(784.0)   . Hepatitis B   . Hypertension    during pregnancy  . Migraine   . Tonsillitis, chronic     Patient Active Problem List   Diagnosis Date Noted  . Ovarian cyst 07/28/2017  . Bilateral carpal tunnel syndrome 04/19/2017  . Trigger finger, acquired 02/06/2017  . Left knee pain 12/06/2016  . Vitamin D deficiency 03/13/2016  . Normochromic normocytic anemia 03/13/2016  . Benign essential hypertension 03/13/2016  . Type 2 diabetes mellitus without complication, without long-term current use of insulin (HCC) 08/28/2015  . Hyperlipidemia 08/28/2015  . Non morbid obesity due to excess calories 02/19/2015  . Gestational diabetes mellitus in childbirth 01/11/2013    Past Surgical History:  Procedure Laterality Date  . NO PAST SURGERIES    . TONSILLECTOMY N/A 06/30/2017   Procedure: TONSILLECTOMY;  Surgeon: Drema HalonNewman, Christopher E, MD;  Location: [facility];  Service: ENT;  Laterality: N/A;    OB History    Gravida  2   Para  2   Term  1   Preterm  1   AB      Living  2     SAB      TAB      Ectopic      Multiple      Live Births  2            Home Medications    Prior to Admission medications   Medication Sig Start Date End Date Taking? Authorizing Provider  celecoxib (CELEBREX) 100 MG capsule Take 100 mg by mouth 2 (two) times daily.    [provider]  clindamycin (CLEOCIN) 300 MG capsule Take 1 capsule (300 mg total) by mouth 3 (three) times daily. 11/02/17   Rise MuLeaphart, Kenneth T, PA-C  ferrous sulfate 325 (65 FE) MG tablet Take 1 tablet (325 mg total) by mouth daily with breakfast. TAKE WITH ORANGE JUICE 05/23/17   Street, [place], [place]    Family History Family History  Problem Relation Age of Onset  . Diabetes Father   . Hypertension Father   . Stroke Father   . Asthma Brother   . Asthma Son   . Hearing loss Maternal Aunt   . Alcohol abuse Neg Hx   . Arthritis Neg Hx   . Birth defects Neg Hx   . Cancer Neg Hx   . COPD Neg Hx   . Depression Neg Hx   . Drug abuse Neg Hx   . Early death Neg Hx   . Heart disease Neg Hx   . Hyperlipidemia  Neg Hx   . Kidney disease Neg Hx   . Learning disabilities Neg Hx   . Mental illness Neg Hx   . Mental retardation Neg Hx   . Miscarriages / Stillbirths Neg Hx   . Vision loss Neg Hx     Social History Social History   Tobacco Use  . Smoking status: Never Smoker  . Smokeless tobacco: Never Used  Substance Use Topics  . Alcohol use: No  . Drug use: No     Allergies   Patient has no known allergies.   Review of Systems Review of Systems  Respiratory: Negative for chest tightness and shortness of breath.   Gastrointestinal: Negative for abdominal distention, abdominal pain and diarrhea.  Musculoskeletal: Positive for arthralgias. Negative for joint swelling and neck pain.  Skin: Negative for color change, rash and wound.  Neurological: Positive for numbness. Negative for dizziness and light-headedness.     Physical Exam Triage Vital Signs ED Triage Vitals  Enc Vitals Group     BP 02/16/18 1427 118/80     Pulse Rate 02/16/18 1427 71      Resp 02/16/18 1427 19     Temp 02/16/18 1427 98.2 F (36.8 C)     Temp src --      SpO2 02/16/18 1427 100 %     Weight --      Height --      Head Circumference --      Peak Flow --      Pain Score 02/16/18 1425 10     Pain Loc --      Pain Edu? --      Excl. in GC? --    No data found.  Updated Vital Signs BP 118/80   Pulse 71   Temp 98.2 F (36.8 C)   Resp 19   LMP 02/07/2018   SpO2 100%   Visual Acuity Right Eye Distance:   Left Eye Distance:   Bilateral Distance:    Right Eye Near:   Left Eye Near:    Bilateral Near:     Physical Exam Eyes:     Extraocular Movements: Extraocular movements intact.     Conjunctiva/sclera: Conjunctivae normal.     Pupils: Pupils are equal, round, and reactive to light.  Neck:     Musculoskeletal: Neck supple. No neck rigidity or muscular tenderness.  Cardiovascular:     Rate and Rhythm: Normal rate and regular rhythm.  Musculoskeletal:     Comments: Tenderness on palpation of the right shoulder over the posterior part of the shoulder.  Limited range of motion mainly with abduction.  Patient has good grip.  Lymphadenopathy:     Cervical: No cervical adenopathy.      UC Treatments / Results  Labs (all labs ordered are listed, but only abnormal results are displayed) Labs Reviewed - No data to display  EKG None  Radiology No results found.  Procedures Procedures (including critical care time)  Medications Ordered in UC Medications - No data to display  Initial Impression / Assessment and Plan / UC Course  I have reviewed the triage vital signs and the nursing notes.  Pertinent labs & imaging results that were available during my care of the patient were reviewed by me and considered in my medical decision making (see chart for details).     1) Right shoulder sprain (likely rotator cuff sprain): Range of motion exercises Continue nonsteroidal anti-inflammatory agents on a consistent basis Orthopedic  referral for possible  shoulder joint injection  Final Clinical Impressions(s) / UC Diagnoses   Final diagnoses:  Acute pain of right shoulder   Discharge Instructions   None    ED Prescriptions    None     Controlled Substance Prescriptions [place] Controlled Substance Registry consulted? No   Merrilee Jansky, MD 02/16/18 [phone]

## 2018-03-21 ENCOUNTER — Encounter (HOSPITAL_BASED_OUTPATIENT_CLINIC_OR_DEPARTMENT_OTHER): Payer: Self-pay | Admitting: Emergency Medicine

## 2018-03-21 ENCOUNTER — Emergency Department (HOSPITAL_BASED_OUTPATIENT_CLINIC_OR_DEPARTMENT_OTHER)
Admission: EM | Admit: 2018-03-21 | Discharge: 2018-03-21 | Disposition: A | Discharge status: Home / Self Care | Payer: BLUE CROSS/BLUE SHIELD | Attending: Emergency Medicine | Admitting: Emergency Medicine

## 2018-03-21 ENCOUNTER — Emergency Department (HOSPITAL_BASED_OUTPATIENT_CLINIC_OR_DEPARTMENT_OTHER): Payer: BLUE CROSS/BLUE SHIELD

## 2018-03-21 ENCOUNTER — Other Ambulatory Visit: Payer: Self-pay

## 2018-03-21 DIAGNOSIS — J069 Acute upper respiratory infection, unspecified: Secondary | ICD-10-CM | POA: Diagnosis not present

## 2018-03-21 DIAGNOSIS — E119 Type 2 diabetes mellitus without complications: Secondary | ICD-10-CM | POA: Insufficient documentation

## 2018-03-21 DIAGNOSIS — J45909 Unspecified asthma, uncomplicated: Secondary | ICD-10-CM | POA: Diagnosis not present

## 2018-03-21 DIAGNOSIS — R05 Cough: Secondary | ICD-10-CM | POA: Diagnosis present

## 2018-03-21 DIAGNOSIS — B9789 Other viral agents as the cause of diseases classified elsewhere: Secondary | ICD-10-CM

## 2018-03-21 DIAGNOSIS — I10 Essential (primary) hypertension: Secondary | ICD-10-CM | POA: Diagnosis not present

## 2018-03-21 LAB — GROUP A STREP BY PCR: Group A Strep by PCR: NOT DETECTED

## 2018-03-21 MED ORDER — OSELTAMIVIR PHOSPHATE 75 MG PO CAPS: 75.0000 mg | ORAL_CAPSULE | Freq: Two times a day (BID) | ORAL | 0 refills | Status: DC

## 2018-03-21 MED ORDER — OSELTAMIVIR PHOSPHATE 75 MG PO CAPS
75.0000 mg | ORAL_CAPSULE | Freq: Once | ORAL | Status: AC
  Administered 2018-03-21: 75 mg via ORAL
  Filled 2018-03-21: qty 1

## 2018-03-21 NOTE — ED Provider Notes (Signed)
[facility] EMERGENCY DEPARTMENT Provider Note   CSN: [id number] Arrival date & time: 03/21/18  0450    History   Chief Complaint Chief Complaint  Patient presents with  . Cough    HPI Mary Richardson is a 48 y.o. female.     Patient with history of asthma presenting with a 2-day history of cough, nasal congestion, sore throat with coughing.  States she has had pain with swallowing as well as with coughing for the past 2 days.  Cough is repetitive of clear mucus.  Denies fever at home but has had chills.  Temp 100.3 in triage.  Denies headache, body aches, abdominal pain, nausea, vomiting, chest pain.  Denies sick contacts.  Denies any recent out of the country travel. Did Not receive a flu shot.  She has been taking DayQuil, NyQuil and ibuprofen without relief.  The history is provided by the patient.  Cough  Associated symptoms: rhinorrhea and sore throat   Associated symptoms: no chest pain, no fever, no headaches, no myalgias, no rash and no shortness of breath     Past Medical History:  Diagnosis Date  . Anemia   . Asthma    no issues as adult  . Headache(784.0)   . Hepatitis B   . Hypertension    during pregnancy  . Migraine   . Tonsillitis, chronic     Patient Active Problem List   Diagnosis Date Noted  . Ovarian cyst 07/28/2017  . Bilateral carpal tunnel syndrome 04/19/2017  . Trigger finger, acquired 02/06/2017  . Left knee pain 12/06/2016  . Vitamin D deficiency 03/13/2016  . Normochromic normocytic anemia 03/13/2016  . Benign essential hypertension 03/13/2016  . Type 2 diabetes mellitus without complication, without long-term current use of insulin (HCC) 08/28/2015  . Hyperlipidemia 08/28/2015  . Non morbid obesity due to excess calories 02/19/2015  . Gestational diabetes mellitus in childbirth 01/11/2013    Past Surgical History:  Procedure Laterality Date  . NO PAST SURGERIES    . TONSILLECTOMY N/A 06/30/2017   Procedure: TONSILLECTOMY;   Surgeon: Drema Halon, MD;  Location: [facility];  Service: ENT;  Laterality: N/A;     OB History    Gravida  2   Para  2   Term  1   Preterm  1   AB      Living  2     SAB      TAB      Ectopic      Multiple      Live Births  2            Home Medications    Prior to Admission medications   Medication Sig Start Date End Date Taking? Authorizing Provider  celecoxib (CELEBREX) 100 MG capsule Take 100 mg by mouth 2 (two) times daily.   Yes [provider]  clindamycin (CLEOCIN) 300 MG capsule Take 1 capsule (300 mg total) by mouth 3 (three) times daily. 11/02/17   Rise Mu, PA-C  ferrous sulfate 325 (65 FE) MG tablet Take 1 tablet (325 mg total) by mouth daily with breakfast. TAKE WITH ORANGE JUICE 05/23/17   Street, [place], [place]    Family History Family History  Problem Relation Age of Onset  . Diabetes Father   . Hypertension Father   . Stroke Father   . Asthma Brother   . Asthma Son   . Hearing loss Maternal Aunt   . Alcohol abuse Neg Hx   .  Arthritis Neg Hx   . Birth defects Neg Hx   . Cancer Neg Hx   . COPD Neg Hx   . Depression Neg Hx   . Drug abuse Neg Hx   . Early death Neg Hx   . Heart disease Neg Hx   . Hyperlipidemia Neg Hx   . Kidney disease Neg Hx   . Learning disabilities Neg Hx   . Mental illness Neg Hx   . Mental retardation Neg Hx   . Miscarriages / Stillbirths Neg Hx   . Vision loss Neg Hx     Social History Social History   Tobacco Use  . Smoking status: Never Smoker  . Smokeless tobacco: Never Used  Substance Use Topics  . Alcohol use: No  . Drug use: No     Allergies   Patient has no known allergies.   Review of Systems Review of Systems  Constitutional: Negative for activity change, appetite change and fever.  HENT: Positive for congestion, rhinorrhea and sore throat. Negative for sinus pressure, sinus pain and trouble swallowing.   Respiratory: Positive for cough.  Negative for shortness of breath.   Cardiovascular: Negative for chest pain.  Gastrointestinal: Negative for abdominal pain, nausea and vomiting.  Genitourinary: Negative for dysuria and hematuria.  Musculoskeletal: Negative for arthralgias and myalgias.  Skin: Negative for rash.  Neurological: Negative for dizziness, weakness and headaches.   all other systems are negative except as noted in the HPI and PMH.     Physical Exam Updated Vital Signs BP (!) 151/92 (BP Location: Right Arm)   Pulse 89   Temp 100.3 F (37.9 C)   Resp 18   Ht 5\' 5"  (1.651 m)   Wt 98 kg   LMP 03/06/2018   SpO2 100%   BMI 35.94 kg/m   Physical Exam Vitals signs and nursing note reviewed.  Constitutional:      General: She is not in acute distress.    Appearance: Normal appearance. She is well-developed and normal weight. She is not ill-appearing or toxic-appearing.     Comments: Speaking in full sentences  HENT:     Head: Normocephalic and atraumatic.     Right Ear: Tympanic membrane normal.     Left Ear: Tympanic membrane normal.     Nose: Rhinorrhea present.     Mouth/Throat:     Mouth: Mucous membranes are moist.     Pharynx: No oropharyngeal exudate or posterior oropharyngeal erythema.  Eyes:     Conjunctiva/sclera: Conjunctivae normal.     Pupils: Pupils are equal, round, and reactive to light.  Neck:     Musculoskeletal: Normal range of motion and neck supple.     Comments: No meningismus. Cardiovascular:     Rate and Rhythm: Normal rate and regular rhythm.     Heart sounds: Normal heart sounds. No murmur.  Pulmonary:     Effort: Pulmonary effort is normal. No respiratory distress.     Breath sounds: Normal breath sounds. No wheezing.  Abdominal:     Palpations: Abdomen is soft.     Tenderness: There is no abdominal tenderness. There is no guarding or rebound.  Musculoskeletal: Normal range of motion.        General: No tenderness.  Skin:    General: Skin is warm.     Capillary  Refill: Capillary refill takes less than 2 seconds.  Neurological:     General: No focal deficit present.     Mental Status: She is alert and  oriented to person, place, and time. Mental status is at baseline.     Cranial Nerves: No cranial nerve deficit.     Motor: No abnormal muscle tone.     Coordination: Coordination normal.     Comments: No ataxia on finger to nose bilaterally. No pronator drift. 5/5 strength throughout. CN 2-12 intact.Equal grip strength. Sensation intact.   Psychiatric:        Behavior: Behavior normal.      ED Treatments / Results  Labs (all labs ordered are listed, but only abnormal results are displayed) Labs Reviewed  GROUP A STREP BY PCR    EKG None  Radiology Dg Chest 2 View  Result Date: 03/21/2018 CLINICAL DATA:  Cough for 2 days EXAM: CHEST - 2 VIEW COMPARISON:  09/22/2016 FINDINGS: Cardiac shadow is within normal limits. The lungs are well aerated bilaterally. No focal infiltrate or sizable effusion is seen. No acute bony abnormality is noted. IMPRESSION: No active cardiopulmonary disease. Electronically Signed   By: Alcide Clever M.D.   On: 03/21/2018 05:56    Procedures Procedures (including critical care time)  Medications Ordered in ED Medications - No data to display   Initial Impression / Assessment and Plan / ED Course  I have reviewed the triage vital signs and the nursing notes.  Pertinent labs & imaging results that were available during my care of the patient were reviewed by me and considered in my medical decision making (see chart for details).       2 days of URI symptoms with cough and sore throat.  Borderline temperature in triage.  No wheezing or respiratory distress.  No wheezing. CXR negative. Rapid strep negative.   Suspect viral respiratory illness, possibly influenza. Risks and benefits of tamiflu d/w patient and accepted.  Discussed supportive care at home. Hydration, antipyretics, antitussives, PCP followup.  Return precautions discussed.   Final Clinical Impressions(s) / ED Diagnoses   Final diagnoses:  Viral URI with cough    ED Discharge Orders    None       Brandolyn Shortridge, Jeannett Senior, MD 03/21/18 [phone]

## 2018-03-21 NOTE — ED Notes (Signed)
Pt understood dc material. NAD noted. Script sent in electronically. All questions answered to satisfaction. Pt escorted to checkout window 

## 2018-03-21 NOTE — ED Triage Notes (Signed)
Pt reports cough, wheezing, and pain with swallowing for the last FEW DAYS.

## 2018-03-21 NOTE — Discharge Instructions (Addendum)
As we discussed you likely have a viral respiratory infection.  There is no evidence of pneumonia.  This is possibly influenza and you have elected to try Tamiflu.  Keep yourself hydrated.  Use Tylenol or ibuprofen as needed for aches and fevers.  Follow-up with your doctor.  Return to the ED with new or worsening symptoms.

## 2018-07-26 ENCOUNTER — Encounter: Payer: Self-pay | Admitting: Family Medicine

## 2018-07-26 ENCOUNTER — Ambulatory Visit (INDEPENDENT_AMBULATORY_CARE_PROVIDER_SITE_OTHER): Payer: BC Managed Care – PPO | Admitting: Family Medicine

## 2018-07-26 ENCOUNTER — Other Ambulatory Visit: Payer: Self-pay

## 2018-07-26 VITALS — BP 134/80 | Ht 66.0 in | Wt 210.0 lb

## 2018-07-26 DIAGNOSIS — M1712 Unilateral primary osteoarthritis, left knee: Secondary | ICD-10-CM

## 2018-07-26 MED ORDER — PENNSAID 2 % TD SOLN: 1.0000 "application " | Freq: Two times a day (BID) | TRANSDERMAL | 3 refills | Status: DC

## 2018-07-26 NOTE — Patient Instructions (Signed)
Nice to meet you Please try the rub on medicine Please try ice  Please try the exercises if the pain isn't severe  Please send me a message in MyChart with any questions or updates.  Please see me back in 3 weeks.   --Dr. Raeford Razor

## 2018-07-26 NOTE — Progress Notes (Signed)
Medication Samples have been provided to the patient.  Drug name: Pennsaid       Strength: 2%       Qty: 2 Boxes  LOT: [id number]  Exp.Date: 02/2019  Dosing instructions: Use a peasize amount and rub gently.  The patient has been instructed regarding the correct time, dose, and frequency of taking this medication, including desired effects and most common side effects.   Sherrie George, [place] 3:42 PM 07/26/2018

## 2018-07-26 NOTE — Progress Notes (Signed)
Mary Richardson - 48 y.o. female MRN [id number]  Date of birth: 01/28/1970  SUBJECTIVE:  Including CC & ROS.  Chief Complaint  Patient presents with  . Knee Pain    left knee x 3 days    Mary Richardson is a 48 y.o. female that is presenting with acute left knee pain.  Reports the pain is been ongoing for the past 3 days.  Is becoming constant and severe.  She denies any inciting event or trauma.  Pain seems to be worse with movement and flexion.  She feels the pain over the medial compartment as well as anteriorly.  Denies any mechanical symptoms.  No improvement modalities thus far.  She has seen orthopedics in the past who gave her gel injections.  No fevers or chills.  Review of the MRI shows a complex tear and extrusion of the medial meniscus.  There are areas of grade 3 and grade 4 articular cartilage loss.   Review of Systems  Constitutional: Negative for fever.  HENT: Negative for congestion.   Respiratory: Negative for cough.   Cardiovascular: Negative for chest pain.  Gastrointestinal: Negative for abdominal pain.  Musculoskeletal: Positive for gait problem.  Skin: Negative for color change.  Neurological: Negative for weakness.  Hematological: Negative for adenopathy.    HISTORY: Past Medical, Surgical, Social, and Family History Reviewed & Updated per EMR.   Pertinent Historical Findings include:  Past Medical History:  Diagnosis Date  . Anemia   . Asthma    no issues as adult  . Headache(784.0)   . Hepatitis B   . Hypertension    during pregnancy  . Migraine   . Tonsillitis, chronic     Past Surgical History:  Procedure Laterality Date  . NO PAST SURGERIES    . TONSILLECTOMY N/A 06/30/2017   Procedure: TONSILLECTOMY;  Surgeon: Rozetta Nunnery, MD;  Location: [facility];  Service: ENT;  Laterality: N/A;    No Known Allergies  Family History  Problem Relation Age of Onset  . Diabetes Father   . Hypertension Father   . Stroke Father   .  Asthma Brother   . Asthma Son   . Hearing loss Maternal Aunt   . Alcohol abuse Neg Hx   . Arthritis Neg Hx   . Birth defects Neg Hx   . Cancer Neg Hx   . COPD Neg Hx   . Depression Neg Hx   . Drug abuse Neg Hx   . Early death Neg Hx   . Heart disease Neg Hx   . Hyperlipidemia Neg Hx   . Kidney disease Neg Hx   . Learning disabilities Neg Hx   . Mental illness Neg Hx   . Mental retardation Neg Hx   . Miscarriages / Stillbirths Neg Hx   . Vision loss Neg Hx      Social History   Socioeconomic History  . Marital status: Single    Spouse name: Not on file  . Number of children: Not on file  . Years of education: Not on file  . Highest education level: Not on file  Occupational History  . Not on file  Social Needs  . Financial resource strain: Not on file  . Food insecurity    Worry: Not on file    Inability: Not on file  . Transportation needs    Medical: Not on file    Non-medical: Not on file  Tobacco Use  . Smoking status: Never Smoker  .  Smokeless tobacco: Never Used  Substance and Sexual Activity  . Alcohol use: No  . Drug use: No  . Sexual activity: Not Currently    Birth control/protection: None  Lifestyle  . Physical activity    Days per week: Not on file    Minutes per session: Not on file  . Stress: Not on file  Relationships  . Social Musicianconnections    Talks on phone: Not on file    Gets together: Not on file    Attends religious service: Not on file    Active member of club or organization: Not on file    Attends meetings of clubs or organizations: Not on file    Relationship status: Not on file  . Intimate partner violence    Fear of current or ex partner: Not on file    Emotionally abused: Not on file    Physically abused: Not on file    Forced sexual activity: Not on file  Other Topics Concern  . Not on file  Social History Narrative  . Not on file     PHYSICAL EXAM:  VS: BP 134/80   Ht 5\' 6"  (1.676 m)   Wt 210 lb (95.3 kg)   BMI  33.89 kg/m  Physical Exam Gen: NAD, alert, cooperative with exam, well-appearing ENT: normal lips, normal nasal mucosa,  Eye: normal EOM, normal conjunctiva and lids CV:  no edema, +2 pedal pulses   Resp: no accessory muscle use, non-labored,  Skin: no rashes, no areas of induration  Neuro: normal tone, normal sensation to touch Psych:  normal insight, alert and oriented MSK:  Left knee: No obvious swelling. Normal flexion abduction. Tenderness to palpation of the medial joint line. No instability. Some pain with McMurray's testing. No pain with patellar grind. Neurovascularly intact     ASSESSMENT & PLAN:   OA (osteoarthritis) of knee Previous MRI showing meniscal tear and cartilage loss from 2019.  Likely has an exacerbation of this underlying change. -Provided samples of Pennsaid.  Pennsaid sent in. -Counseled on home exercise therapy and supportive care. -If no improvement will consider imaging and injections. -Work note provided to be excuse for the next 3 days.

## 2018-07-27 NOTE — Assessment & Plan Note (Signed)
Previous MRI showing meniscal tear and cartilage loss from 2019.  Likely has an exacerbation of this underlying change. -Provided samples of Pennsaid.  Pennsaid sent in. -Counseled on home exercise therapy and supportive care. -If no improvement will consider imaging and injections. -Work note provided to be excuse for the next 3 days.

## 2018-08-16 ENCOUNTER — Emergency Department (HOSPITAL_BASED_OUTPATIENT_CLINIC_OR_DEPARTMENT_OTHER)
Admission: EM | Admit: 2018-08-16 | Discharge: 2018-08-16 | Disposition: A | Discharge status: Home / Self Care | Payer: BC Managed Care – PPO | Attending: Emergency Medicine | Admitting: Emergency Medicine

## 2018-08-16 ENCOUNTER — Ambulatory Visit: Payer: BC Managed Care – PPO | Admitting: Family Medicine

## 2018-08-16 ENCOUNTER — Encounter (HOSPITAL_BASED_OUTPATIENT_CLINIC_OR_DEPARTMENT_OTHER): Payer: Self-pay | Admitting: Emergency Medicine

## 2018-08-16 ENCOUNTER — Emergency Department (HOSPITAL_BASED_OUTPATIENT_CLINIC_OR_DEPARTMENT_OTHER): Payer: BC Managed Care – PPO

## 2018-08-16 ENCOUNTER — Other Ambulatory Visit: Payer: Self-pay

## 2018-08-16 DIAGNOSIS — U071 COVID-19: Secondary | ICD-10-CM | POA: Diagnosis not present

## 2018-08-16 DIAGNOSIS — I10 Essential (primary) hypertension: Secondary | ICD-10-CM | POA: Diagnosis not present

## 2018-08-16 DIAGNOSIS — Z79899 Other long term (current) drug therapy: Secondary | ICD-10-CM | POA: Insufficient documentation

## 2018-08-16 DIAGNOSIS — E119 Type 2 diabetes mellitus without complications: Secondary | ICD-10-CM | POA: Diagnosis not present

## 2018-08-16 DIAGNOSIS — J45909 Unspecified asthma, uncomplicated: Secondary | ICD-10-CM | POA: Diagnosis not present

## 2018-08-16 DIAGNOSIS — R079 Chest pain, unspecified: Secondary | ICD-10-CM | POA: Diagnosis present

## 2018-08-16 HISTORY — DX: Unspecified osteoarthritis, unspecified site: M19.90

## 2018-08-16 HISTORY — DX: Pure hypercholesterolemia, unspecified: E78.00

## 2018-08-16 LAB — CBC
HCT: 33.1 % — ABNORMAL LOW (ref 36.0–46.0)
Hemoglobin: 9.8 g/dL — ABNORMAL LOW (ref 12.0–15.0)
MCH: 24.8 pg — ABNORMAL LOW (ref 26.0–34.0)
MCHC: 29.6 g/dL — ABNORMAL LOW (ref 30.0–36.0)
MCV: 83.8 fL (ref 80.0–100.0)
Platelets: 291 10*3/uL (ref 150–400)
RBC: 3.95 MIL/uL (ref 3.87–5.11)
RDW: 14.6 % (ref 11.5–15.5)
WBC: 4.8 10*3/uL (ref 4.0–10.5)
nRBC: 0 % (ref 0.0–0.2)

## 2018-08-16 LAB — URINALYSIS, ROUTINE W REFLEX MICROSCOPIC
Bilirubin Urine: NEGATIVE
Glucose, UA: NEGATIVE mg/dL
Hgb urine dipstick: NEGATIVE
Ketones, ur: NEGATIVE mg/dL
Leukocytes,Ua: NEGATIVE
Nitrite: NEGATIVE
Protein, ur: NEGATIVE mg/dL
Specific Gravity, Urine: 1.02 (ref 1.005–1.030)
pH: 6 (ref 5.0–8.0)

## 2018-08-16 LAB — COMPREHENSIVE METABOLIC PANEL
ALT: 16 U/L (ref 0–44)
AST: 19 U/L (ref 15–41)
Albumin: 3.6 g/dL (ref 3.5–5.0)
Alkaline Phosphatase: 74 U/L (ref 38–126)
Anion gap: 7 (ref 5–15)
BUN: 8 mg/dL (ref 6–20)
CO2: 25 mmol/L (ref 22–32)
Calcium: 9.2 mg/dL (ref 8.9–10.3)
Chloride: 106 mmol/L (ref 98–111)
Creatinine, Ser: 0.49 mg/dL (ref 0.44–1.00)
GFR calc Af Amer: >60 mL/min (ref >60)
GFR calc non Af Amer: >60 mL/min (ref >60)
Glucose, Bld: 89 mg/dL (ref 70–99)
Potassium: 4.1 mmol/L (ref 3.5–5.1)
Sodium: 138 mmol/L (ref 135–145)
Total Bilirubin: 0.4 mg/dL (ref 0.3–1.2)
Total Protein: 7.5 g/dL (ref 6.5–8.1)

## 2018-08-16 LAB — SARS CORONAVIRUS 2 BY RT PCR (HOSPITAL ORDER, PERFORMED IN ~~LOC~~ HOSPITAL LAB): SARS Coronavirus 2: POSITIVE — AB

## 2018-08-16 MED ORDER — ACETAMINOPHEN 500 MG PO TABS
ORAL_TABLET | ORAL | Status: AC
  Administered 2018-08-16: 1000 mg via ORAL
  Filled 2018-08-16: qty 2

## 2018-08-16 MED ORDER — ACETAMINOPHEN 500 MG PO TABS
1000.0000 mg | ORAL_TABLET | Freq: Once | ORAL | Status: AC
  Administered 2018-08-16: 13:00:00 1000 mg via ORAL

## 2018-08-16 NOTE — Progress Notes (Deleted)
Mary Richardson - 48 y.o. female MRN [id number]  Date of birth: [birth year]  SUBJECTIVE:  Including CC & ROS.  No chief complaint on file.   Mary Richardson is a 48 y.o. female that is  ***.  ***   Review of Systems  HISTORY: Past Medical, Surgical, Social, and Family History Reviewed & Updated per EMR.   Pertinent Historical Findings include:  Past Medical History:  Diagnosis Date  . Anemia   . Asthma    no issues as adult  . Headache(784.0)   . Hepatitis B   . Hypertension    during pregnancy  . Migraine   . Tonsillitis, chronic     Past Surgical History:  Procedure Laterality Date  . NO PAST SURGERIES    . TONSILLECTOMY N/A 06/30/2017   Procedure: TONSILLECTOMY;  Surgeon: Rozetta Nunnery, MD;  Location: [facility];  Service: ENT;  Laterality: N/A;    No Known Allergies  Family History  Problem Relation Age of Onset  . Diabetes Father   . Hypertension Father   . Stroke Father   . Asthma Brother   . Asthma Son   . Hearing loss Maternal Aunt   . Alcohol abuse Neg Hx   . Arthritis Neg Hx   . Birth defects Neg Hx   . Cancer Neg Hx   . COPD Neg Hx   . Depression Neg Hx   . Drug abuse Neg Hx   . Early death Neg Hx   . Heart disease Neg Hx   . Hyperlipidemia Neg Hx   . Kidney disease Neg Hx   . Learning disabilities Neg Hx   . Mental illness Neg Hx   . Mental retardation Neg Hx   . Miscarriages / Stillbirths Neg Hx   . Vision loss Neg Hx      Social History   Socioeconomic History  . Marital status: Single    Spouse name: Not on file  . Number of children: Not on file  . Years of education: Not on file  . Highest education level: Not on file  Occupational History  . Not on file  Social Needs  . Financial resource strain: Not on file  . Food insecurity    Worry: Not on file    Inability: Not on file  . Transportation needs    Medical: Not on file    Non-medical: Not on file  Tobacco Use  . Smoking status: Never Smoker  .  Smokeless tobacco: Never Used  Substance and Sexual Activity  . Alcohol use: No  . Drug use: No  . Sexual activity: Not Currently    Birth control/protection: None  Lifestyle  . Physical activity    Days per week: Not on file    Minutes per session: Not on file  . Stress: Not on file  Relationships  . Social Herbalist on phone: Not on file    Gets together: Not on file    Attends religious service: Not on file    Active member of club or organization: Not on file    Attends meetings of clubs or organizations: Not on file    Relationship status: Not on file  . Intimate partner violence    Fear of current or ex partner: Not on file    Emotionally abused: Not on file    Physically abused: Not on file    Forced sexual activity: Not on file  Other Topics Concern  . Not  on file  Social History Narrative  . Not on file     PHYSICAL EXAM:  VS: There were no vitals taken for this visit. Physical Exam Gen: NAD, alert, cooperative with exam, well-appearing ENT: normal lips, normal nasal mucosa,  Eye: normal EOM, normal conjunctiva and lids CV:  no edema, +2 pedal pulses   Resp: no accessory muscle use, non-labored,  GI: no masses or tenderness, no hernia  Skin: no rashes, no areas of induration  Neuro: normal tone, normal sensation to touch Psych:  normal insight, alert and oriented MSK:  ***      ASSESSMENT & PLAN:   No problem-specific Assessment & Plan notes found for this encounter.

## 2018-08-16 NOTE — ED Triage Notes (Signed)
Pt c/o centralized chest pain with some in her back as well since last night.  Some sob.  Productive cough with yellow sputum.  Pt felt cold chills last night and believes she may have had a fever.

## 2018-08-16 NOTE — ED Provider Notes (Signed)
[facility] EMERGENCY DEPARTMENT Provider Note   CSN: [id number] Arrival date & time: 08/16/18  1145     History   Chief Complaint Chief Complaint  Patient presents with  . Chest Pain    HPI Mary Richardson is a 48 y.o. female.     Patient c/o fever, non prod cough, body aches, for past few days. Symptoms acute onset, moderate, persistent, slowly worse. Denies headache. No chest pain or discomfort. No sob. Denies abd pain or nvd. No dysuria or gu c/o. No specific known covid+ exposure. Denies rash. No neck pain or stiffness. No sore throat or trouble swallowing.   The history is provided by the patient.  Chest Pain Associated symptoms: cough and fever   Associated symptoms: no abdominal pain, no back pain, no headache, no shortness of breath and no vomiting     Past Medical History:  Diagnosis Date  . Anemia   . Asthma    no issues as adult  . Gestational diabetes   . Headache(784.0)   . Hepatitis B   . Hypercholesteremia   . Hypertension    during pregnancy  . Migraine   . Osteoarthritis   . Tonsillitis, chronic     Patient Active Problem List   Diagnosis Date Noted  . Ovarian cyst 07/28/2017  . Bilateral carpal tunnel syndrome 04/19/2017  . Trigger finger, acquired 02/06/2017  . OA (osteoarthritis) of knee 12/06/2016  . Vitamin D deficiency 03/13/2016  . Normochromic normocytic anemia 03/13/2016  . Benign essential hypertension 03/13/2016  . Type 2 diabetes mellitus without complication, without long-term current use of insulin ([place]) 08/28/2015  . Hyperlipidemia 08/28/2015  . Non morbid obesity due to excess calories 02/19/2015  . Gestational diabetes mellitus in childbirth 01/11/2013    Past Surgical History:  Procedure Laterality Date  . NO PAST SURGERIES    . TONSILLECTOMY N/A 06/30/2017   Procedure: TONSILLECTOMY;  Surgeon: Rozetta Nunnery, MD;  Location: [facility];  Service: ENT;  Laterality: N/A;     OB History    Gravida  2   Para  2   Term  1   Preterm  1   AB      Living  2     SAB      TAB      Ectopic      Multiple      Live Births  2            Home Medications    Prior to Admission medications   Medication Sig Start Date End Date Taking? Authorizing Provider  celecoxib (CELEBREX) 200 MG capsule TAKE ONE CAPSULE BY MOUTH EVERY DAY WITH FOOD FOR PAIN AND INFLAMMATION 09/08/17  Yes [provider]    Family History Family History  Problem Relation Age of Onset  . Diabetes Father   . Hypertension Father   . Stroke Father   . Asthma Brother   . Asthma Son   . Hearing loss Maternal Aunt   . Alcohol abuse Neg Hx   . Arthritis Neg Hx   . Birth defects Neg Hx   . Cancer Neg Hx   . COPD Neg Hx   . Depression Neg Hx   . Drug abuse Neg Hx   . Early death Neg Hx   . Heart disease Neg Hx   . Hyperlipidemia Neg Hx   . Kidney disease Neg Hx   . Learning disabilities Neg Hx   . Mental illness Neg Hx   .  Mental retardation Neg Hx   . Miscarriages / Stillbirths Neg Hx   . Vision loss Neg Hx     Social History Social History   Tobacco Use  . Smoking status: Never Smoker  . Smokeless tobacco: Never Used  Substance Use Topics  . Alcohol use: No  . Drug use: No     Allergies   Patient has no known allergies.   Review of Systems Review of Systems  Constitutional: Positive for fever.  HENT: Negative for sore throat.   Eyes: Negative for redness.  Respiratory: Positive for cough. Negative for shortness of breath.   Cardiovascular: Positive for chest pain.  Gastrointestinal: Negative for abdominal pain, diarrhea and vomiting.  Endocrine: Negative for polyuria.  Genitourinary: Negative for flank pain.  Musculoskeletal: Positive for myalgias. Negative for back pain, neck pain and neck stiffness.  Skin: Negative for rash.  Neurological: Negative for headaches.  Hematological: Does not bruise/bleed easily.  Psychiatric/Behavioral: Negative for  confusion.     Physical Exam Updated Vital Signs BP 127/76   Pulse 75   Temp 99.7 F (37.6 C) (Oral)   Resp (!) 26   Ht 1.676 m (5\' 6" )   Wt 95.3 kg   LMP 07/19/2018   SpO2 99%   BMI 33.89 kg/m   Physical Exam Vitals signs and nursing note reviewed.  Constitutional:      Appearance: Normal appearance. She is well-developed.  HENT:     Head: Atraumatic.     Nose: Nose normal.     Mouth/Throat:     Mouth: Mucous membranes are moist.  Eyes:     General: No scleral icterus.    Conjunctiva/sclera: Conjunctivae normal.  Neck:     Musculoskeletal: Normal range of motion and neck supple. No neck rigidity or muscular tenderness.     Trachea: No tracheal deviation.  Cardiovascular:     Rate and Rhythm: Normal rate and regular rhythm.     Pulses: Normal pulses.     Heart sounds: Normal heart sounds. No murmur. No friction rub. No gallop.   Pulmonary:     Effort: Pulmonary effort is normal. No respiratory distress.     Breath sounds: Normal breath sounds.  Abdominal:     General: Bowel sounds are normal. There is no distension.     Palpations: Abdomen is soft.     Tenderness: There is no abdominal tenderness. There is no guarding.  Genitourinary:    Comments: No cva tenderness.  Musculoskeletal:        General: No swelling.     Right lower leg: No edema.     Left lower leg: No edema.  Skin:    General: Skin is warm and dry.     Findings: No rash.  Neurological:     Mental Status: She is alert.     Comments: Alert, speech normal. Steady gait.   Psychiatric:        Mood and Affect: Mood normal.      ED Treatments / Results  Labs (all labs ordered are listed, but only abnormal results are displayed) Results for orders placed or performed during the hospital encounter of 08/16/18  SARS Coronavirus 2 [facility](Hospital order[facility] Nasopharyngeal Nasopharyngeal Swab   Specimen: Nasopharyngeal Swab  Result Value Ref Range   SARS Coronavirus 2  POSITIVE (A) NEGATIVE  CBC  Result Value Ref Range   WBC 4.8 4.0 - 10.5 K/uL   RBC 3.95 3.87 - 5.11 MIL/uL  Hemoglobin 9.8 (L) 12.0 - 15.0 g/dL   HCT 16.133.1 (L) 09.636.0 - 04.546.0 %   MCV 83.8 80.0 - 100.0 fL   MCH 24.8 (L) 26.0 - 34.0 pg   MCHC 29.6 (L) 30.0 - 36.0 g/dL   RDW 40.914.6 81.111.5 - 91.415.5 %   Platelets 291 150 - 400 K/uL   nRBC 0.0 0.0 - 0.2 %  Comprehensive metabolic panel  Result Value Ref Range   Sodium 138 135 - 145 mmol/L   Potassium 4.1 3.5 - 5.1 mmol/L   Chloride 106 98 - 111 mmol/L   CO2 25 22 - 32 mmol/L   Glucose, Bld 89 70 - 99 mg/dL   BUN 8 6 - 20 mg/dL   Creatinine, Ser 7.820.49 0.44 - 1.00 mg/dL   Calcium 9.2 8.9 - 95.610.3 mg/dL   Total Protein 7.5 6.5 - 8.1 g/dL   Albumin 3.6 3.5 - 5.0 g/dL   AST 19 15 - 41 U/L   ALT 16 0 - 44 U/L   Alkaline Phosphatase 74 38 - 126 U/L   Total Bilirubin 0.4 0.3 - 1.2 mg/dL   GFR calc non Af Amer >60 >60 mL/min   GFR calc Af Amer >60 >60 mL/min   Anion gap 7 5 - 15  Urinalysis, Routine w reflex microscopic  Result Value Ref Range   Color, Urine YELLOW YELLOW   APPearance CLEAR CLEAR   Specific Gravity, Urine 1.020 1.005 - 1.030   pH 6.0 5.0 - 8.0   Glucose, UA NEGATIVE NEGATIVE mg/dL   Hgb urine dipstick NEGATIVE NEGATIVE   Bilirubin Urine NEGATIVE NEGATIVE   Ketones, ur NEGATIVE NEGATIVE mg/dL   Protein, ur NEGATIVE NEGATIVE mg/dL   Nitrite NEGATIVE NEGATIVE   Leukocytes,Ua NEGATIVE NEGATIVE   Dg Chest Port 1 View  Result Date: 08/16/2018 CLINICAL DATA:  Cough EXAM: PORTABLE CHEST 1 VIEW COMPARISON:  March 21, 2018 FINDINGS: Lungs are clear. Heart size and pulmonary vascularity are normal. No adenopathy. No bone lesions. IMPRESSION: No edema or consolidation. Electronically Signed   By: Bretta BangWilliam  Woodruff III M.D.   On: 08/16/2018 12:37    EKG EKG Interpretation  Date/Time:  Monday August 16 2018 11:55:57 EDT Ventricular Rate:  79 PR Interval:    QRS Duration: 89 QT Interval:  337 QTC Calculation: 387 R Axis:   28 Text  Interpretation:  Sinus rhythm Nonspecific T wave abnormality Confirmed by Cathren LaineSteinl, Anamaria Dusenbury ([id number]) on 08/16/2018 12:10:58 PM   Radiology Dg Chest Port 1 View  Result Date: 08/16/2018 CLINICAL DATA:  Cough EXAM: PORTABLE CHEST 1 VIEW COMPARISON:  March 21, 2018 FINDINGS: Lungs are clear. Heart size and pulmonary vascularity are normal. No adenopathy. No bone lesions. IMPRESSION: No edema or consolidation. Electronically Signed   By: Bretta BangWilliam  Woodruff III M.D.   On: 08/16/2018 12:37    Procedures Procedures (including critical care time)  Medications Ordered in ED Medications  acetaminophen (TYLENOL) tablet 1,000 mg (1,000 mg Oral Given 08/16/18 1317)     Initial Impression / Assessment and Plan / ED Course  I have reviewed the triage vital signs and the nursing notes.  Pertinent labs & imaging results that were available during my care of the patient were reviewed by me and considered in my medical decision making (see chart for details).  Labs sent.  Reviewed nursing notes and prior charts for additional history.   Labs reviewed by me - covid test is positive.   Rhett Kray was evaluated in Emergency Department on 08/16/2018 for  the symptoms described in the history of present illness. She was evaluated in the context of the global COVID-19 pandemic, which necessitated consideration that the patient might be at risk for infection with the SARS-CoV-2 virus that causes COVID-19. Institutional protocols and algorithms that pertain to the evaluation of patients at risk for COVID-19 are in a state of rapid change based on information released by regulatory bodies including the CDC and federal and state organizations. These policies and algorithms were followed during the patient's care in the ED.  CXR reviewed by me - no pna.  Discussed results w pt. And discussed quarantine instructions and return precautions.  On recheck, pulse ox 100% room air. No increased wob. Pt tolerating po. Appears well,  not toxic.   Pt appears stable for d/c.     Final Clinical Impressions(s) / ED Diagnoses   Final diagnoses:  None    ED Discharge Orders    None       Cathren LaineSteinl, Celina Shiley, MD 08/16/18 1531

## 2018-08-16 NOTE — Discharge Instructions (Signed)
It was our pleasure to provide your ER care today - we hope that you feel better.  Use masking, social distancing, and wash hands frequently.   See covid instruction. Quarantine self/family for atleast 2 weeks and until fevers/cough have resolved for 3 days.  Return to ER if worse, new symptoms, increased trouble breathing, other concern.

## 2018-08-30 ENCOUNTER — Other Ambulatory Visit: Payer: Self-pay

## 2018-08-30 DIAGNOSIS — Z20822 Contact with and (suspected) exposure to covid-19: Secondary | ICD-10-CM

## 2018-08-31 LAB — NOVEL CORONAVIRUS, NAA: SARS-CoV-2, NAA: NOT DETECTED

## 2019-04-21 ENCOUNTER — Ambulatory Visit: Payer: BC Managed Care – PPO | Attending: Internal Medicine

## 2019-04-21 DIAGNOSIS — Z23 Encounter for immunization: Secondary | ICD-10-CM

## 2019-04-21 NOTE — Progress Notes (Signed)
   Covid-19 Vaccination Clinic  Name:  Mary Richardson    MRN: [id number] DOB: May 15, 1970  04/21/2019  Mary Richardson was observed post Covid-19 immunization for 15 minutes without incident. She was provided with Vaccine Information Sheet and instruction to access the V-Safe system.   Mary Richardson was instructed to call 911 with any severe reactions post vaccine: Marland Kitchen Difficulty breathing  . Swelling of face and throat  . A fast heartbeat  . A bad rash all over body  . Dizziness and weakness   Immunizations Administered    Name Date Dose VIS Date Route   Pfizer COVID-19 Vaccine 04/21/2019  8:33 AM 0.3 mL 12/24/2018 Intramuscular   Manufacturer: [employer], [employer]   Lot: [id number]   NDC: [address]-4

## 2019-05-16 ENCOUNTER — Ambulatory Visit: Payer: BC Managed Care – PPO | Attending: Internal Medicine

## 2019-05-16 DIAGNOSIS — Z23 Encounter for immunization: Secondary | ICD-10-CM

## 2019-05-16 NOTE — Progress Notes (Signed)
   Covid-19 Vaccination Clinic  Name:  Mary Richardson    MRN: [id number] DOB: 1970/09/11  05/16/2019  Mary Richardson was observed post Covid-19 immunization for 15 minutes without incident. She was provided with Vaccine Information Sheet and instruction to access the V-Safe system.   Mary Richardson was instructed to call 911 with any severe reactions post vaccine: Marland Kitchen Difficulty breathing  . Swelling of face and throat  . A fast heartbeat  . A bad rash all over body  . Dizziness and weakness   Immunizations Administered    Name Date Dose VIS Date Route   Pfizer COVID-19 Vaccine 05/16/2019  8:18 AM 0.3 mL 03/09/2018 Intramuscular   Manufacturer: [employer], [employer]   Lot: [id number]   NDC: [address]-5

## 2019-07-01 ENCOUNTER — Other Ambulatory Visit: Payer: Self-pay | Admitting: Obstetrics and Gynecology

## 2019-07-01 DIAGNOSIS — R928 Other abnormal and inconclusive findings on diagnostic imaging of breast: Secondary | ICD-10-CM

## 2019-07-04 ENCOUNTER — Other Ambulatory Visit: Payer: Self-pay

## 2019-07-04 ENCOUNTER — Ambulatory Visit
Admission: RE | Admit: 2019-07-04 | Discharge: 2019-07-04 | Disposition: A | Discharge status: Home / Self Care | Payer: BC Managed Care – PPO | Source: Ambulatory Visit | Attending: Obstetrics and Gynecology | Admitting: Obstetrics and Gynecology

## 2019-07-04 ENCOUNTER — Ambulatory Visit: Admission: RE | Admit: 2019-07-04 | Payer: BC Managed Care – PPO | Source: Ambulatory Visit

## 2019-07-04 ENCOUNTER — Ambulatory Visit: Payer: BC Managed Care – PPO

## 2019-07-04 DIAGNOSIS — R928 Other abnormal and inconclusive findings on diagnostic imaging of breast: Secondary | ICD-10-CM

## 2019-07-07 ENCOUNTER — Encounter (HOSPITAL_BASED_OUTPATIENT_CLINIC_OR_DEPARTMENT_OTHER): Payer: Self-pay | Admitting: Emergency Medicine

## 2019-07-07 ENCOUNTER — Emergency Department (HOSPITAL_BASED_OUTPATIENT_CLINIC_OR_DEPARTMENT_OTHER)
Admission: EM | Admit: 2019-07-07 | Discharge: 2019-07-07 | Disposition: A | Discharge status: Home / Self Care | Payer: BC Managed Care – PPO | Attending: Emergency Medicine | Admitting: Emergency Medicine

## 2019-07-07 ENCOUNTER — Other Ambulatory Visit: Payer: Self-pay

## 2019-07-07 DIAGNOSIS — M79604 Pain in right leg: Secondary | ICD-10-CM | POA: Insufficient documentation

## 2019-07-07 DIAGNOSIS — M791 Myalgia, unspecified site: Secondary | ICD-10-CM | POA: Diagnosis not present

## 2019-07-07 DIAGNOSIS — M5431 Sciatica, right side: Secondary | ICD-10-CM | POA: Diagnosis not present

## 2019-07-07 DIAGNOSIS — E119 Type 2 diabetes mellitus without complications: Secondary | ICD-10-CM | POA: Diagnosis not present

## 2019-07-07 DIAGNOSIS — M25551 Pain in right hip: Secondary | ICD-10-CM | POA: Diagnosis not present

## 2019-07-07 DIAGNOSIS — I10 Essential (primary) hypertension: Secondary | ICD-10-CM | POA: Insufficient documentation

## 2019-07-07 DIAGNOSIS — J45909 Unspecified asthma, uncomplicated: Secondary | ICD-10-CM | POA: Diagnosis not present

## 2019-07-07 DIAGNOSIS — M79606 Pain in leg, unspecified: Secondary | ICD-10-CM | POA: Diagnosis present

## 2019-07-07 MED ORDER — KETOROLAC TROMETHAMINE 15 MG/ML IJ SOLN
30.0000 mg | Freq: Once | INTRAMUSCULAR | Status: AC
  Administered 2019-07-07: 30 mg via INTRAMUSCULAR
  Filled 2019-07-07: qty 2

## 2019-07-07 MED ORDER — DICLOFENAC SODIUM 1 % EX GEL: 2.0000 g | Freq: Four times a day (QID) | CUTANEOUS | 0 refills | Status: DC

## 2019-07-07 MED ORDER — GABAPENTIN 100 MG PO CAPS
100.0000 mg | ORAL_CAPSULE | Freq: Three times a day (TID) | ORAL | 0 refills | Status: DC
Start: 2019-07-07 — End: 2020-03-20

## 2019-07-07 NOTE — ED Notes (Signed)
Provider at bedside

## 2019-07-07 NOTE — ED Triage Notes (Signed)
Pain to right low back, right hip, and down into right leg x3 days.  No known injury.  Sts she had a similar pain 2 years ago and she came here and got a shot and it went away.

## 2019-07-07 NOTE — ED Provider Notes (Signed)
[facility] EMERGENCY DEPARTMENT Provider Note   CSN: [id number] Arrival date & time: 07/07/19  1742     History Chief Complaint  Patient presents with  . Back, hip, and leg pain.    Sherise Bastedo is a 49 y.o. female.  Patient is a 49 year old female who presents with right leg pain.  She has pain in her right buttocks that radiates down her right leg.  It started yesterday.  Is worse with ambulation.  She has no numbness or weakness in her leg.  No loss of bowel or bladder control.  No fevers.  No abdominal pain.  She does have a history of sciatica and it feels similar.  She denies any injuries to the area.        Past Medical History:  Diagnosis Date  . Anemia   . Asthma    no issues as adult  . Gestational diabetes   . Headache(784.0)   . Hepatitis B   . Hypercholesteremia   . Hypertension    during pregnancy  . Migraine   . Osteoarthritis   . Tonsillitis, chronic     Patient Active Problem List   Diagnosis Date Noted  . Ovarian cyst 07/28/2017  . Bilateral carpal tunnel syndrome 04/19/2017  . Trigger finger, acquired 02/06/2017  . OA (osteoarthritis) of knee 12/06/2016  . Vitamin D deficiency 03/13/2016  . Normochromic normocytic anemia 03/13/2016  . Benign essential hypertension 03/13/2016  . Type 2 diabetes mellitus without complication, without long-term current use of insulin ([facility]) 08/28/2015  . Hyperlipidemia 08/28/2015  . Non morbid obesity due to excess calories 02/19/2015  . Gestational diabetes mellitus in childbirth 01/11/2013    Past Surgical History:  Procedure Laterality Date  . NO PAST SURGERIES    . TONSILLECTOMY N/A 06/30/2017   Procedure: TONSILLECTOMY;  Surgeon: Drema Halon, MD;  Location: [facility];  Service: ENT;  Laterality: N/A;     OB History    Gravida  2   Para  2   Term  1   Preterm  1   AB      Living  2     SAB      TAB      Ectopic      Multiple      Live Births  2            Family History  Problem Relation Age of Onset  . Diabetes Father   . Hypertension Father   . Stroke Father   . Asthma Brother   . Asthma Son   . Hearing loss Maternal Aunt   . Alcohol abuse Neg Hx   . Arthritis Neg Hx   . Birth defects Neg Hx   . Cancer Neg Hx   . COPD Neg Hx   . Depression Neg Hx   . Drug abuse Neg Hx   . Early death Neg Hx   . Heart disease Neg Hx   . Hyperlipidemia Neg Hx   . Kidney disease Neg Hx   . Learning disabilities Neg Hx   . Mental illness Neg Hx   . Mental retardation Neg Hx   . Miscarriages / Stillbirths Neg Hx   . Vision loss Neg Hx     Social History   Tobacco Use  . Smoking status: Never Smoker  . Smokeless tobacco: Never Used  Vaping Use  . Vaping Use: Never used  Substance Use Topics  . Alcohol use: No  . Drug  use: No    Home Medications Prior to Admission medications   Medication Sig Start Date End Date Taking? Authorizing Provider  ferrous sulfate 325 (65 FE) MG EC tablet Take 1 tablet by mouth daily. 03/21/19   [provider]  norethindrone (MICRONOR) 0.35 MG tablet Take 1 tablet by mouth daily. 06/07/19   [provider]    Allergies    Patient has no known allergies.  Review of Systems   Review of Systems  Constitutional: Negative for fever.  Gastrointestinal: Negative for nausea and vomiting.  Musculoskeletal: Positive for myalgias. Negative for arthralgias, back pain, joint swelling and neck pain.  Skin: Negative for wound.  Neurological: Negative for weakness, numbness and headaches.    Physical Exam Updated Vital Signs BP 137/79   Pulse (!) 50   Temp 98.6 F (37 C) (Oral)   Resp 18   Ht 5\' 6"  (1.676 m)   Wt 97.5 kg   LMP 06/09/2019   SpO2 100%   BMI 34.70 kg/m   Physical Exam Constitutional:      Appearance: She is well-developed.  HENT:     Head: Normocephalic and atraumatic.  Cardiovascular:     Rate and Rhythm: Normal rate.  Pulmonary:     Effort: Pulmonary effort  is normal.  Musculoskeletal:        General: Tenderness present.     Cervical back: Normal range of motion and neck supple.     Comments: Positive tenderness over the right sciatic nerve area.  There is some tenderness over the SI joint.  Negative straight leg raise bilaterally.  She has normal sensation and motor function to her lower leg.  Pedal pulses are intact.  Skin:    General: Skin is warm and dry.  Neurological:     Mental Status: She is alert and oriented to person, place, and time.     ED Results / Procedures / Treatments   Labs (all labs ordered are listed, but only abnormal results are displayed) Labs Reviewed - No data to display  EKG None  Radiology No results found.  Procedures Procedures (including critical care time)  Medications Ordered in ED Medications  ketorolac (TORADOL) 15 MG/ML injection 30 mg (30 mg Intramuscular Given 07/07/19 2240)    ED Course  I have reviewed the triage vital signs and the nursing notes.  Pertinent labs & imaging results that were available during my care of the patient were reviewed by me and considered in my medical decision making (see chart for details).    MDM Rules/Calculators/A&P                          Patient is a 49 year old female who presents with symptoms consistent with sciatica.  She does not have any neurologic deficits or suggestions of cauda equina.  No recent injuries that would warrant imaging.  She said the last time she got gabapentin that worked well.  She was given a Toradol shot in the ED as she is driving and cannot receive any opioid type medications.  She does not want to use steroids as they make her gain weight and her blood sugars go high.  We will start her on Neurontin and Voltaren gel.  She will follow-up with her PCP.  Return precautions were given. Final Clinical Impression(s) / ED Diagnoses Final diagnoses:  Sciatica of right side    Rx / DC Orders ED Discharge Orders    None  Rolan Bucco, MD 07/07/19 2246

## 2019-07-25 IMAGING — CR DG CHEST 2V
2 series · 2 of 2 positions shown · non-contrast
Comparison: 09/22/2016

CLINICAL DATA: Cough for 2 days

EXAM:
CHEST - 2 VIEW

[w chest pa]
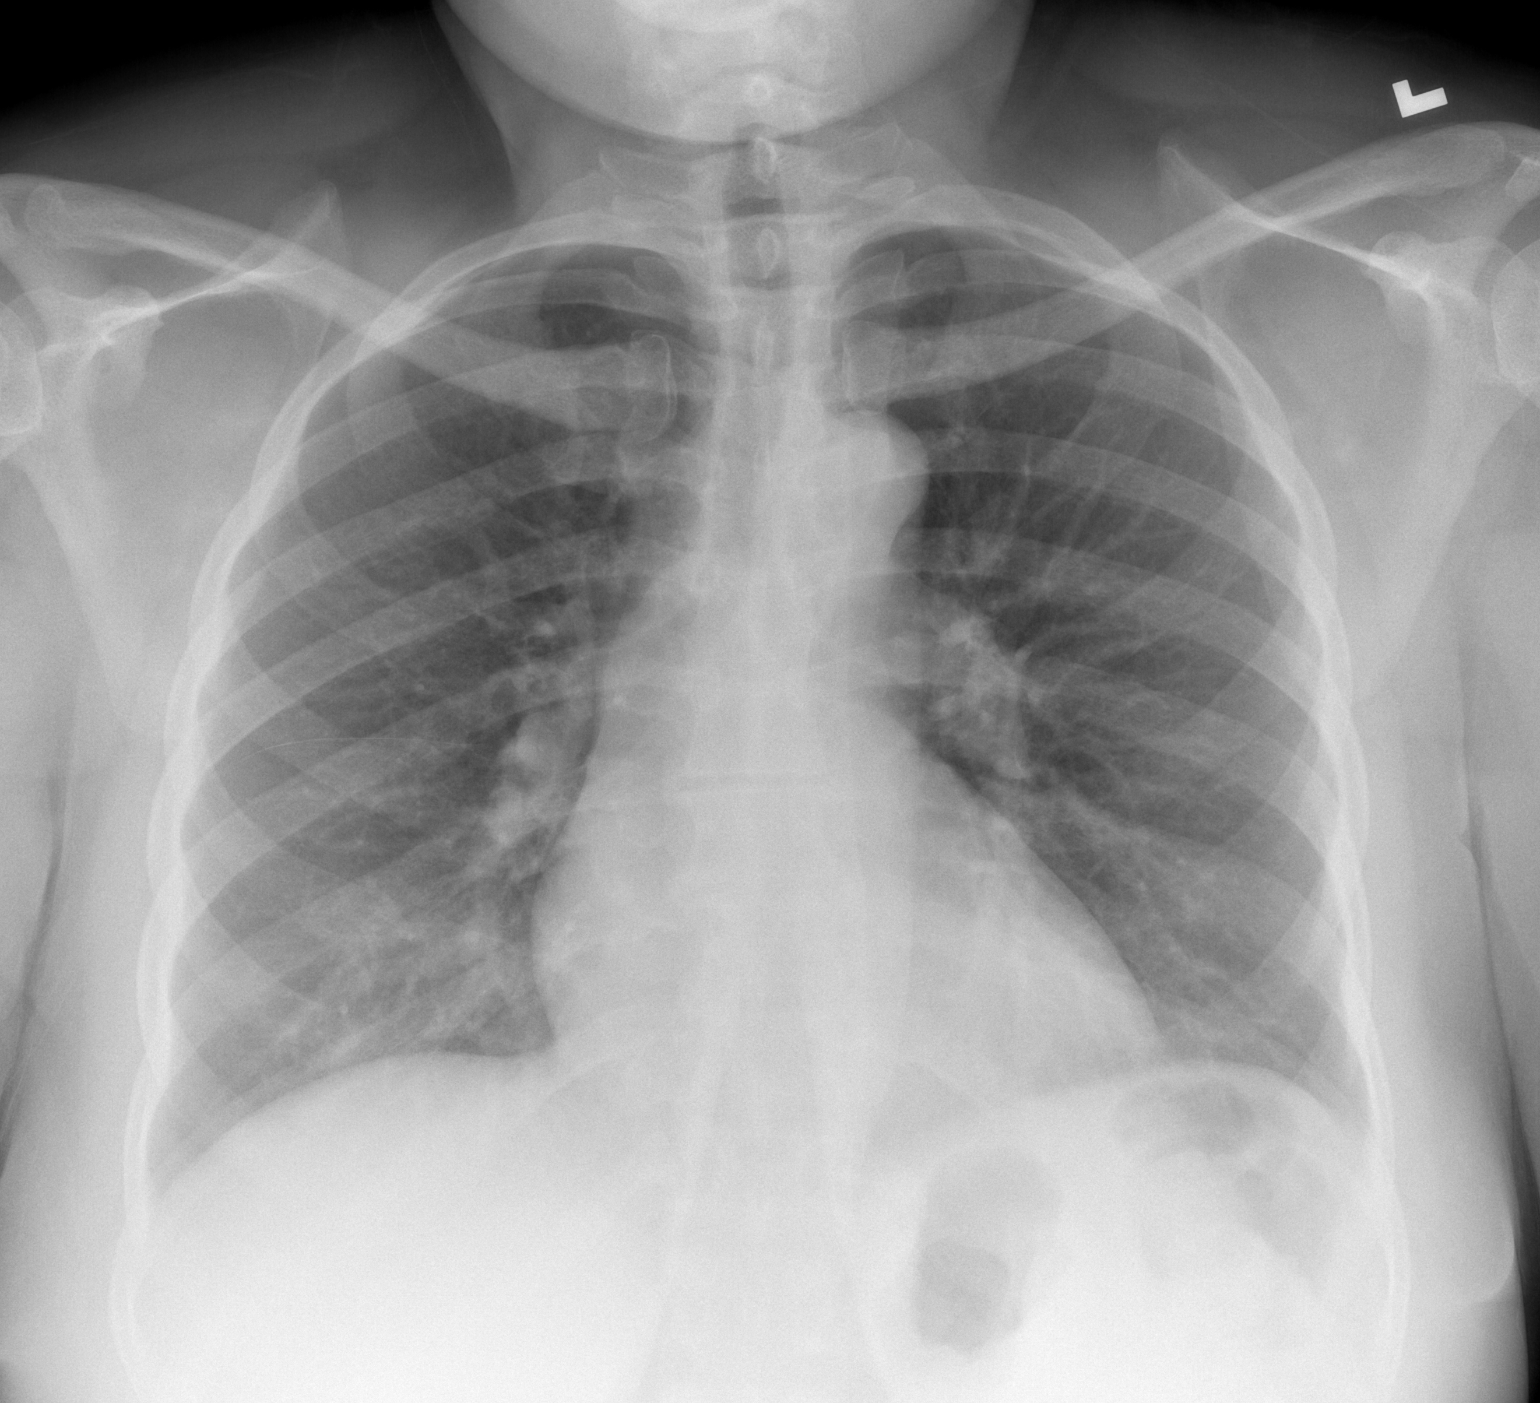

[w chest lat]
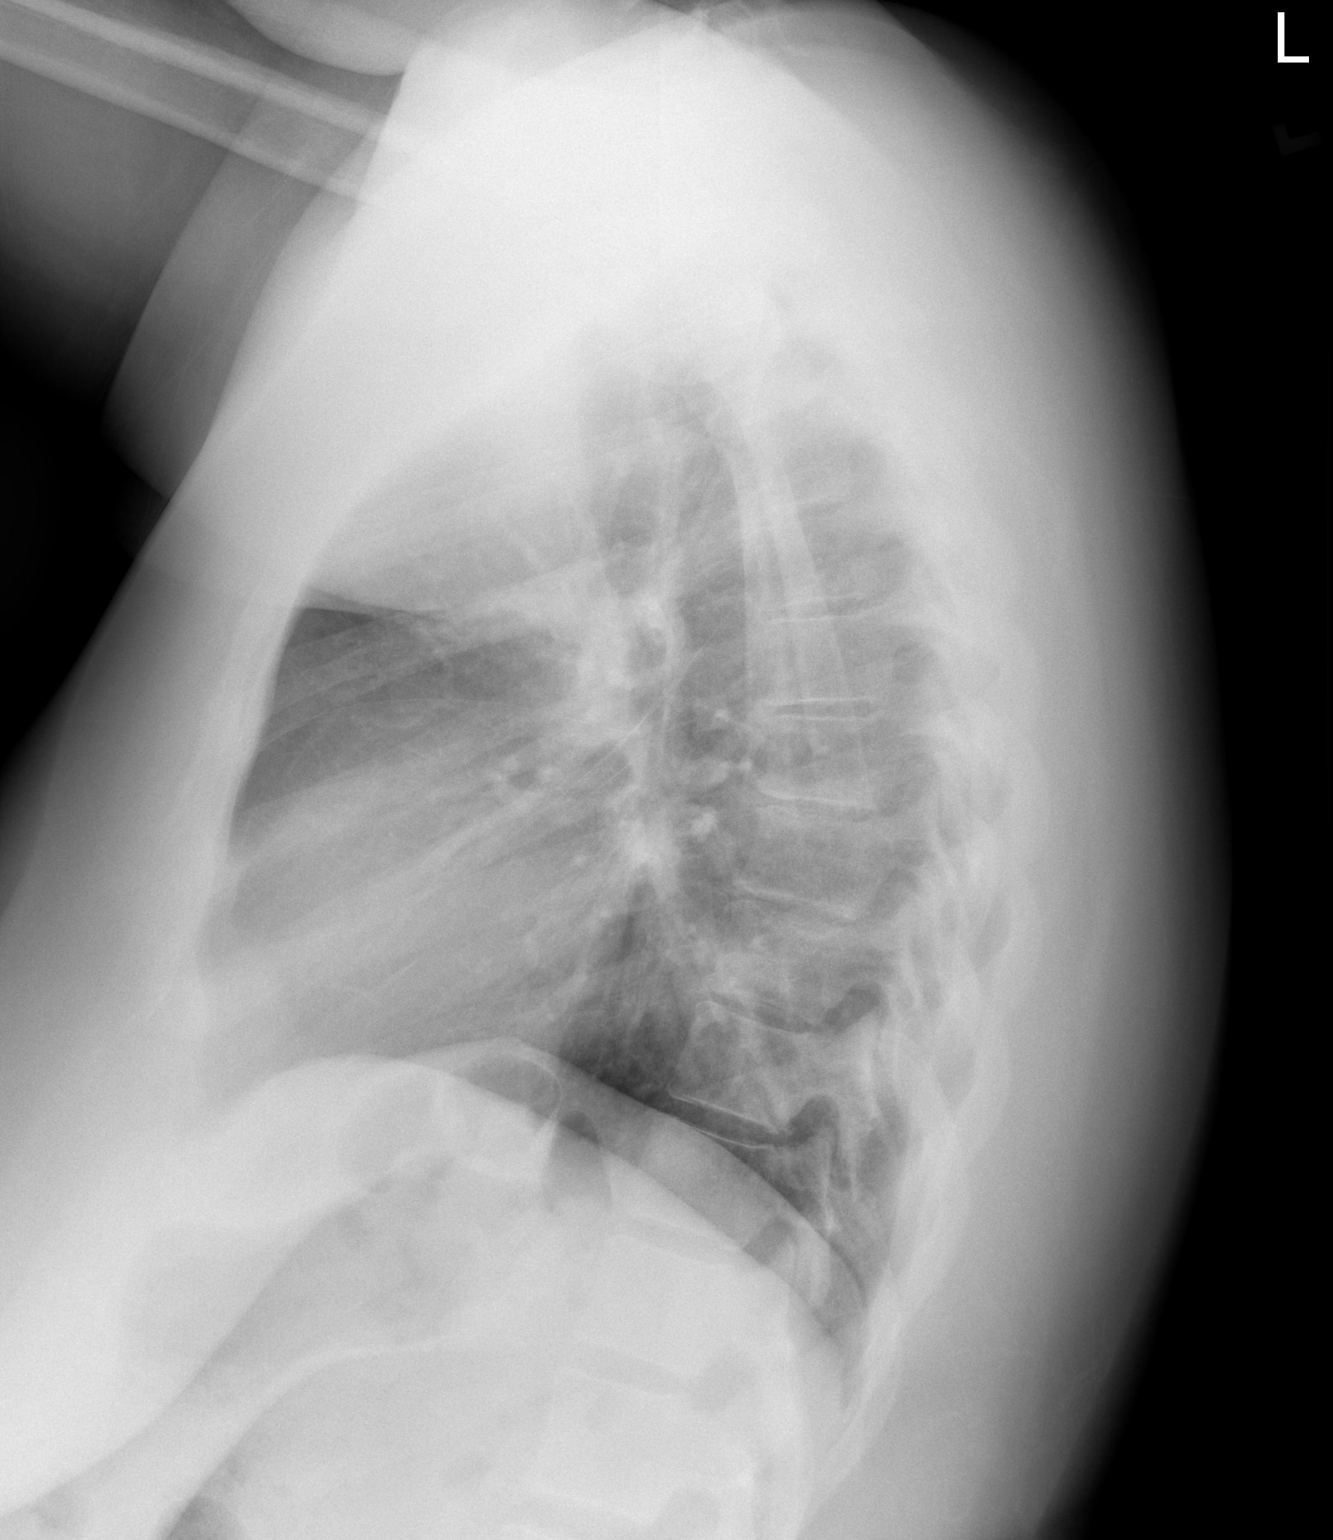

[2 of 2 positions shown; findings below may reference images not displayed]

FINDINGS: Cardiac shadow is within normal limits. The lungs are well aerated
bilaterally. No focal infiltrate or sizable effusion is seen. No
acute bony abnormality is noted.
IMPRESSION: No active cardiopulmonary disease.

## 2020-03-13 ENCOUNTER — Telehealth: Payer: Self-pay | Admitting: Physician Assistant

## 2020-03-13 NOTE — Telephone Encounter (Signed)
Received a new hem referral from Dr. Thurston Hole for IDA. Pt has been cld and scheduled to see Cassie on 3/8 at 1:30pm w/labs at 1pm. Pt aware to arrive 20 minutes early.

## 2020-03-16 ENCOUNTER — Other Ambulatory Visit: Payer: Self-pay | Admitting: Physician Assistant

## 2020-03-16 DIAGNOSIS — D500 Iron deficiency anemia secondary to blood loss (chronic): Secondary | ICD-10-CM

## 2020-03-16 NOTE — Progress Notes (Signed)
[facility] Telephone:([phone]   Fax:([phone]  CONSULT NOTE  REFERRING PHYSICIAN: Dr. Thurston Hole  REASON FOR CONSULTATION:  Iron Deficiency Anemia  HPI Mary Richardson is a 50 y.o. female with past medical history significant for prediabetes, osteoarthritis, hypertension, menorrhagia, depression, and hyperlipidemia, who was referred to the clinic for evaluation of iron deficiency anemia.  The patient was recently seen by orthopedics due to needing a knee replacement which is tentatively scheduled for June 1st, 2022.  The patient's labs demonstrated microcytic anemia on routine lab work performed 1 month prior.  The patient was started on oral iron supplement and her iron studies were rechecked on 03/06/2020.  At that time her hemoglobin was low at 9.2, her MCV was slightly low at 79.0.  Her iron was low at 34, her TIBC was elevated at 509, her saturation was low at 7%, and her ferritin was low at 3%.  She had a hemoglobin electrophoresis performed which was normal.  The patient was subsequently referred to our clinic today for further evaluation and recommendations regarding management of her iron deficiency anemia.  The patient follows with her primary care provider and OBGYN for menorrhagia.  The patient was placed on birth control pills which has been helping control her heavy menstrual cycles. Prior to starting the birth control pills, she had menstrual cycles every 3 weeks or so lasting 7 days. She states her periods were heavy and she would use 10 tampons/pads per day. Since starting birth control, she only had one menstrual cycle from December and it only lasted 5 days. She states she only needed to use 3 tampons/pads per day. She denies history of fibroids. She sees [facility] for her heavy periods. She sees them again on April 4th.   Per chart review, the oldest CBC records available to me are from 2010 in which the patient had evidence of anemia at that time. She  states she has been anemic "all of my life". She has been taking iron supplements on and off her entire life. She admits she is not always compliant with her iron pills. She will take it when she remembers to take it which she estimates as 10 per month. She has never needed IV Iron before. She denies blood thinner use. Regarding symptoms, the patient reports some fatigue, occasional lightheadedness, and dizziness. She also sometimes reports shortness of breath with exertion. She does not crave ice. She denies significant chest pain or palpitations. Shedenieslightheadedness.She denies any other abnormal bleeding including hemoptysis, hematemesis, or hematuria. She has a nose bleed 1-2x per winter. She states it bleeds for approximately 5 minutes or so. She has never had a colonoscopy. She does take 800 mg of ibuprofen about 5x per week. Denies history of stomach ulcers.She denies any particular dietary habits such as being a vegan or vegetarian. She eats red meatapproximately 1 per week. The patient denies any history of any bariatric surgeries. She denies any fever, chills, night sweats, or unexplained weight loss.   Her family history consists of a mother with osteoarthritis and hypertension. Her father had diabetes, hypertension, and osteoarthritis. Her sister had severe anemia secondary to fibroids. She also had hypertension. Her brother has asthma. Her brother had hypertension.   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 She is single with 2 children. Denies smoking, drug, or alcohol use.   HPI  Past Medical History:  Diagnosis Date  . Anemia   . Asthma    no issues as  adult  . Gestational diabetes   . Headache(784.0)   . Hepatitis B   . Hypercholesteremia   . Hypertension    during pregnancy  . Migraine   . Osteoarthritis   . Tonsillitis, chronic     Past Surgical History:  Procedure Laterality Date  . NO PAST SURGERIES    . TONSILLECTOMY N/A 06/30/2017   Procedure: TONSILLECTOMY;   Surgeon: Drema Halon, MD;  Location: [facility];  Service: ENT;  Laterality: N/A;    Family History  Problem Relation Age of Onset  . Diabetes Father   . Hypertension Father   . Stroke Father   . Asthma Brother   . Asthma Son   . Hearing loss Maternal Aunt   . Alcohol abuse Neg Hx   . Arthritis Neg Hx   . Birth defects Neg Hx   . Cancer Neg Hx   . COPD Neg Hx   . Depression Neg Hx   . Drug abuse Neg Hx   . Early death Neg Hx   . Heart disease Neg Hx   . Hyperlipidemia Neg Hx   . Kidney disease Neg Hx   . Learning disabilities Neg Hx   . Mental illness Neg Hx   . Mental retardation Neg Hx   . Miscarriages / Stillbirths Neg Hx   . Vision loss Neg Hx     Social History Social History   Tobacco Use  . Smoking status: Never Smoker  . Smokeless tobacco: Never Used  Vaping Use  . Vaping Use: Never used  Substance Use Topics  . Alcohol use: No  . Drug use: No    No Known Allergies  Current Outpatient Medications  Medication Sig Dispense Refill  . diclofenac Sodium (VOLTAREN) 1 % GEL Apply 2 g topically 4 (four) times daily. 50 g 0  . ferrous sulfate 325 (65 FE) MG EC tablet Take 1 tablet by mouth daily.    Marland Kitchen gabapentin (NEURONTIN) 100 MG capsule Take 1 capsule (100 mg total) by mouth 3 (three) times daily. 60 capsule 0  . norethindrone (MICRONOR) 0.35 MG tablet Take 1 tablet by mouth daily.     No current facility-administered medications for this visit.    REVIEW OF SYSTEMS:   Review of Systems  Constitutional: Positive for fatigue. Negative for appetite change, chills, fever and unexpected weight change.  HENT: Negative for mouth sores, nosebleeds, sore throat and trouble swallowing.   Eyes: Negative for eye problems and icterus.  Respiratory: Positive for occasional shortness of breath. Negative for cough, hemoptysis, and wheezing.   Cardiovascular: Negative for chest pain and leg swelling.  Gastrointestinal: Negative for abdominal  pain, constipation, diarrhea, nausea and vomiting.  Genitourinary: Negative for bladder incontinence, difficulty urinating, dysuria, frequency and hematuria.   Musculoskeletal: Negative for back pain, gait problem, neck pain and neck stiffness.  Skin: Negative for itching and rash.  Neurological: Negative for dizziness, extremity weakness, gait problem, headaches, light-headedness and seizures.  Hematological: Negative for adenopathy. Does not bruise/bleed easily.  Psychiatric/Behavioral: Negative for confusion, depression and sleep disturbance. The patient is not nervous/anxious.     PHYSICAL EXAMINATION:  There were no vitals taken for this visit.  ECOG PERFORMANCE STATUS: 1 - Symptomatic but completely ambulatory  Physical Exam  Constitutional: Oriented to person, place, and time and well-developed, well-nourished, and in no distress.  HENT:  Head: Normocephalic and atraumatic.  Mouth/Throat: Oropharynx is clear and moist. No oropharyngeal exudate.  Eyes: Conjunctivae are normal. Right eye exhibits  no discharge. Left eye exhibits no discharge. No scleral icterus.  Neck: Normal range of motion. Neck supple.  Cardiovascular: Normal rate, regular rhythm, normal heart sounds and intact distal pulses.   Pulmonary/Chest: Effort normal and breath sounds normal. No respiratory distress. No wheezes. No rales.  Abdominal: Soft. Bowel sounds are normal. Exhibits no distension and no mass. There is no tenderness.  Musculoskeletal: Normal range of motion. Exhibits no edema.  Lymphadenopathy:    No cervical adenopathy.  Neurological: Alert and oriented to person, place, and time. Exhibits normal muscle tone. Gait normal. Coordination normal.  Skin: Skin is warm and dry. No rash noted. Not diaphoretic. No erythema. No pallor.  Psychiatric: Mood, memory and judgment normal.  Vitals reviewed.  LABORATORY DATA: Lab Results  Component Value Date   WBC 4.8 08/16/2018   HGB 9.8 (L) 08/16/2018    HCT 33.1 (L) 08/16/2018   MCV 83.8 08/16/2018   PLT 291 08/16/2018      Chemistry      Component Value Date/Time   NA 138 08/16/2018 1315   K 4.1 08/16/2018 1315   CL 106 08/16/2018 1315   CO2 25 08/16/2018 1315   BUN 8 08/16/2018 1315   CREATININE 0.49 08/16/2018 1315      Component Value Date/Time   CALCIUM 9.2 08/16/2018 1315   ALKPHOS 74 08/16/2018 1315   AST 19 08/16/2018 1315   ALT 16 08/16/2018 1315   BILITOT 0.4 08/16/2018 1315       RADIOGRAPHIC STUDIES: No results found.  ASSESSMENT: This is a very pleasatnt 50 year old female with iron deficiency anemia due to menorrhagia.   PLAN: The patient was seen with Dr. Arbutus PedMohamed today. The patient had several lab studies performed including a CBC, CMP, iron studies, ferritin, B12, SPEP with IFE, and folic acid. Her labs today show microcytic anemia with a Hbg of 9.1 and MCV of 78.0. Her iron studies continue to show sifnificant anemia. Her other labs are pending at this time.   Dr. Arbutus PedMohamed recommends that we arrange for IV iron with venofer 300 mg weekly x3. We will arrange for this to start next week.   We will see her back for a follow up visit and repeat labs in 7 weeks or so for a repeat CBC, iron studies, and ferritin.   She was encouraged to continue to take her oral iron supplement. She was instructed to take this with vitamin C. She was also given a handout on iron rich food.   She was given stool cards to rule out GI blood loss.   She will follow up with her OBGYN for her menorrhagia, although this is significantly improved since starting birth control pills.   She does take 800 mg of NSAIDs 5x per week. I encouraged the patient to cut down on her NSAID use as it can promote stomach ulcers.    The patient voices understanding of current disease status and treatment options and is in agreement with the current care plan.  All questions were answered. The patient knows to call the clinic with any problems,  questions or concerns. We can certainly see the patient much sooner if necessary.  Thank you so much for allowing me to participate in the care of Cheyene Finazzo. I will continue to follow up the patient with you and assist in her care.  I spent 40-54 minutes in this encounter.   Disclaimer: This note was dictated with voice recognition software. Similar sounding words can inadvertently be  transcribed and may not be corrected upon review.   Devota Viruet L Hailly Fess March 16, 2020, 2:14 PM  ADDENDUM: Hematology/Oncology Attending: I had a face-to-face encounter with the patient today.  I reviewed her records, labs and recommended her care plan.  This is a very pleasant 50 years old African female originally from [place] with past medical history significant for osteoarthritis, hypertension, menorrhagia, depression, dyslipidemia as well as prediabetes.  The patient has been complaining of knee pain for a while and she was seen by orthopedic surgery for evaluation of her condition and consideration of knee replacement which is currently scheduled to be done in early January 2022.  Preoperative blood work including CBC showed anemia with hemoglobin of 9.2 and MCV of 79.  The patient had iron study and it showed low iron level of 34 with total iron binding capacity of 549 and iron saturation of 7% and ferritin level of 3%.  The patient was a started on oral iron tablet but she could not tolerate it.  She also has a history of menorrhagia and followed by gynecology.  She did not have any gastrointestinal procedure.  She has been using a lot of NSAIDs for her knee pain and arthritis. The patient was referred to [place] today for evaluation and recommendation regarding her anemia and also for consideration of IV iron infusion if needed.  She mentions that she has anemia for several years.  She denied having any concerning complaints except for occasional lightheadedness and fatigue.  She does not have any dietary  restrictions. I had a lengthy discussion with the patient today about her current condition and treatment options.  We order several studies today including CBC which showed hemoglobin of 9.1 and hematocrit of 30.9% with MCV of 78.0.  The patient has normal white blood count as well as normal platelet count.  Ferritin level was low at 6.  Iron study was 32 with iron binding capacity of 530 and iron saturation 6%.  She has normal serum folate and vitamin B12 level. I recommended for the patient to proceed with iron infusion with Venofer 300 mg IV weekly for 3 weeks. We will see the patient back for follow-up visit in around 6 weeks for evaluation and repeat CBC, iron study and ferritin. The patient was advised to follow with her gynecologist for evaluation and management of her menorrhagia. We will also check her stool for Hemoccult. The patient was advised to call immediately if she has any other concerning symptoms in the interval. The total time spent in the appointment was 60 minutes.  Disclaimer: This note was dictated with voice recognition software. Similar sounding words can inadvertently be transcribed and may be missed upon review. Lajuana Matte, MD 03/20/20

## 2020-03-20 ENCOUNTER — Other Ambulatory Visit: Payer: Self-pay

## 2020-03-20 ENCOUNTER — Inpatient Hospital Stay: Payer: BC Managed Care – PPO | Attending: Physician Assistant | Admitting: Physician Assistant

## 2020-03-20 ENCOUNTER — Inpatient Hospital Stay: Payer: BC Managed Care – PPO

## 2020-03-20 ENCOUNTER — Encounter: Payer: Self-pay | Admitting: Physician Assistant

## 2020-03-20 DIAGNOSIS — E785 Hyperlipidemia, unspecified: Secondary | ICD-10-CM | POA: Insufficient documentation

## 2020-03-20 DIAGNOSIS — I10 Essential (primary) hypertension: Secondary | ICD-10-CM | POA: Insufficient documentation

## 2020-03-20 DIAGNOSIS — Z79899 Other long term (current) drug therapy: Secondary | ICD-10-CM | POA: Diagnosis not present

## 2020-03-20 DIAGNOSIS — D500 Iron deficiency anemia secondary to blood loss (chronic): Secondary | ICD-10-CM

## 2020-03-20 DIAGNOSIS — F32A Depression, unspecified: Secondary | ICD-10-CM | POA: Diagnosis not present

## 2020-03-20 DIAGNOSIS — Z8249 Family history of ischemic heart disease and other diseases of the circulatory system: Secondary | ICD-10-CM | POA: Diagnosis not present

## 2020-03-20 DIAGNOSIS — N920 Excessive and frequent menstruation with regular cycle: Secondary | ICD-10-CM | POA: Diagnosis present

## 2020-03-20 DIAGNOSIS — D509 Iron deficiency anemia, unspecified: Secondary | ICD-10-CM | POA: Insufficient documentation

## 2020-03-20 DIAGNOSIS — Z833 Family history of diabetes mellitus: Secondary | ICD-10-CM | POA: Insufficient documentation

## 2020-03-20 LAB — CBC WITH DIFFERENTIAL (CANCER CENTER ONLY)
Abs Immature Granulocytes: 0.02 10*3/uL (ref 0.00–0.07)
Basophils Absolute: 0.1 10*3/uL (ref 0.0–0.1)
Basophils Relative: 1 %
Eosinophils Absolute: 0.1 10*3/uL (ref 0.0–0.5)
Eosinophils Relative: 1 %
HCT: 30.9 % — ABNORMAL LOW (ref 36.0–46.0)
Hemoglobin: 9.1 g/dL — ABNORMAL LOW (ref 12.0–15.0)
Immature Granulocytes: 0 %
Lymphocytes Relative: 26 %
Lymphs Abs: 2.2 10*3/uL (ref 0.7–4.0)
MCH: 23 pg — ABNORMAL LOW (ref 26.0–34.0)
MCHC: 29.4 g/dL — ABNORMAL LOW (ref 30.0–36.0)
MCV: 78 fL — ABNORMAL LOW (ref 80.0–100.0)
Monocytes Absolute: 0.7 10*3/uL (ref 0.1–1.0)
Monocytes Relative: 8 %
Neutro Abs: 5.2 10*3/uL (ref 1.7–7.7)
Neutrophils Relative %: 64 %
Platelet Count: 363 10*3/uL (ref 150–400)
RBC: 3.96 MIL/uL (ref 3.87–5.11)
RDW: 16.1 % — ABNORMAL HIGH (ref 11.5–15.5)
WBC Count: 8.3 10*3/uL (ref 4.0–10.5)
nRBC: 0 % (ref 0.0–0.2)

## 2020-03-20 LAB — CMP (CANCER CENTER ONLY)
ALT: 16 U/L (ref 0–44)
AST: 14 U/L — ABNORMAL LOW (ref 15–41)
Albumin: 3.4 g/dL — ABNORMAL LOW (ref 3.5–5.0)
Alkaline Phosphatase: 88 U/L (ref 38–126)
Anion gap: 6 (ref 5–15)
BUN: 12 mg/dL (ref 6–20)
CO2: 25 mmol/L (ref 22–32)
Calcium: 9.1 mg/dL (ref 8.9–10.3)
Chloride: 105 mmol/L (ref 98–111)
Creatinine: 0.7 mg/dL (ref 0.44–1.00)
GFR, Estimated: >60 mL/min (ref >60)
Glucose, Bld: 99 mg/dL (ref 70–99)
Potassium: 3.8 mmol/L (ref 3.5–5.1)
Sodium: 136 mmol/L (ref 135–145)
Total Bilirubin: 0.4 mg/dL (ref 0.3–1.2)
Total Protein: 7.5 g/dL (ref 6.5–8.1)

## 2020-03-20 LAB — FOLATE: Folate: 6.3 ng/mL (ref >5.9)

## 2020-03-20 LAB — IRON AND TIBC
Iron: 32 ug/dL — ABNORMAL LOW (ref 41–142)
Saturation Ratios: 6 % — ABNORMAL LOW (ref 21–57)
TIBC: 530 ug/dL — ABNORMAL HIGH (ref 236–444)
UIBC: 498 ug/dL — ABNORMAL HIGH (ref 120–384)

## 2020-03-20 LAB — FERRITIN: Ferritin: 6 ng/mL — ABNORMAL LOW (ref 11–307)

## 2020-03-20 LAB — VITAMIN B12: Vitamin B-12: 454 pg/mL (ref 180–914)

## 2020-03-20 NOTE — Patient Instructions (Signed)

## 2020-03-22 ENCOUNTER — Telehealth: Payer: Self-pay | Admitting: Internal Medicine

## 2020-03-22 LAB — PROTEIN ELECTROPHORESIS, SERUM, WITH REFLEX
A/G Ratio: 1 (ref 0.7–1.7)
Albumin ELP: 3.6 g/dL (ref 2.9–4.4)
Alpha-1-Globulin: 0.2 g/dL (ref 0.0–0.4)
Alpha-2-Globulin: 0.9 g/dL (ref 0.4–1.0)
Beta Globulin: 1.2 g/dL (ref 0.7–1.3)
Gamma Globulin: 1.2 g/dL (ref 0.4–1.8)
Globulin, Total: 3.5 g/dL (ref 2.2–3.9)
Total Protein ELP: 7.1 g/dL (ref 6.0–8.5)

## 2020-03-22 NOTE — Telephone Encounter (Signed)
Scheduled per los. Called and spoke with patient. Confirmed appt 

## 2020-04-02 ENCOUNTER — Other Ambulatory Visit: Payer: Self-pay

## 2020-04-02 ENCOUNTER — Inpatient Hospital Stay: Payer: BC Managed Care – PPO

## 2020-04-02 VITALS — BP 131/74 | HR 63 | Temp 98.1°F | Resp 18

## 2020-04-02 DIAGNOSIS — D500 Iron deficiency anemia secondary to blood loss (chronic): Secondary | ICD-10-CM | POA: Diagnosis not present

## 2020-04-02 MED ORDER — DIPHENHYDRAMINE HCL 25 MG PO CAPS
ORAL_CAPSULE | ORAL | Status: AC
  Filled 2020-04-02: qty 1

## 2020-04-02 MED ORDER — SODIUM CHLORIDE 0.9 % IV SOLN
300.0000 mg | Freq: Once | INTRAVENOUS | Status: AC
  Administered 2020-04-02: 300 mg via INTRAVENOUS
  Filled 2020-04-02: qty 10

## 2020-04-02 MED ORDER — ACETAMINOPHEN 325 MG PO TABS
ORAL_TABLET | ORAL | Status: AC
  Filled 2020-04-02: qty 2

## 2020-04-02 MED ORDER — ACETAMINOPHEN 325 MG PO TABS
650.0000 mg | ORAL_TABLET | Freq: Once | ORAL | Status: AC
  Administered 2020-04-02: 650 mg via ORAL

## 2020-04-02 MED ORDER — SODIUM CHLORIDE 0.9 % IV SOLN
Freq: Once | INTRAVENOUS | Status: AC
  Filled 2020-04-02: qty 250

## 2020-04-02 MED ORDER — DIPHENHYDRAMINE HCL 25 MG PO CAPS
25.0000 mg | ORAL_CAPSULE | Freq: Once | ORAL | Status: AC
  Administered 2020-04-02: 25 mg via ORAL

## 2020-04-02 NOTE — Patient Instructions (Signed)
Iron Dextran injection °What is this medicine? °IRON DEXTRAN (AHY ern DEX tran) is an iron complex. Iron is used to make healthy red blood cells, which carry oxygen and nutrients through the body. This medicine is used to treat people who cannot take iron by mouth and have low levels of iron in the blood. °This medicine may be used for other purposes; ask your health care provider or pharmacist if you have questions. °COMMON BRAND NAME(S): Dexferrum, INFeD °What should I tell my health care provider before I take this medicine? °They need to know if you have any of these conditions: °· anemia not caused by low iron levels °· heart disease °· high levels of iron in the blood °· kidney disease °· liver disease °· an unusual or allergic reaction to iron, other medicines, foods, dyes, or preservatives °· pregnant or trying to get pregnant °· breast-feeding °How should I use this medicine? °This medicine is for injection into a vein or a muscle. It is given by a health care professional in a hospital or clinic setting. °Talk to your pediatrician regarding the use of this medicine in children. While this drug may be prescribed for children as young as 4 months old for selected conditions, precautions do apply. °Overdosage: If you think you have taken too much of this medicine contact a poison control center or emergency room at once. °NOTE: This medicine is only for you. Do not share this medicine with others. °What if I miss a dose? °It is important not to miss your dose. Call your doctor or health care professional if you are unable to keep an appointment. °What may interact with this medicine? °Do not take this medicine with any of the following medications: °· deferoxamine °· dimercaprol °· other iron products °This medicine may also interact with the following medications: °· chloramphenicol °· deferasirox °This list may not describe all possible interactions. Give your health care provider a list of all the  medicines, herbs, non-prescription drugs, or dietary supplements you use. Also tell them if you smoke, drink alcohol, or use illegal drugs. Some items may interact with your medicine. °What should I watch for while using this medicine? °Visit your doctor or health care professional regularly. Tell your doctor if your symptoms do not start to get better or if they get worse. You may need blood work done while you are taking this medicine. °You may need to follow a special diet. Talk to your doctor. Foods that contain iron include: whole grains/cereals, dried fruits, beans, or peas, leafy green vegetables, and organ meats (liver, kidney). °Long-term use of this medicine may increase your risk of some cancers. Talk to your doctor about how to limit your risk. °What side effects may I notice from receiving this medicine? °Side effects that you should report to your doctor or health care professional as soon as possible: °· allergic reactions like skin rash, itching or hives, swelling of the face, lips, or tongue °· blue lips, nails, or skin °· breathing problems °· changes in blood pressure °· chest pain °· confusion °· fast, irregular heartbeat °· feeling faint or lightheaded, falls °· fever or chills °· flushing, sweating, or hot feelings °· joint or muscle aches or pains °· pain, tingling, numbness in the hands or feet °· seizures °· unusually weak or tired °Side effects that usually do not require medical attention (report to your doctor or health care professional if they continue or are bothersome): °· change in taste (metallic taste) °·   diarrhea °· headache °· irritation at site where injected °· nausea, vomiting °· stomach upset °This list may not describe all possible side effects. Call your doctor for medical advice about side effects. You may report side effects to FDA at 1-800-FDA-1088. °Where should I keep my medicine? °This drug is given in a hospital or clinic and will not be stored at home. °NOTE: This  sheet is a summary. It may not cover all possible information. If you have questions about this medicine, talk to your doctor, pharmacist, or health care provider. °© 2021 Elsevier/Gold Standard (2007-05-18 16:59:50) ° °

## 2020-04-09 ENCOUNTER — Inpatient Hospital Stay: Payer: BC Managed Care – PPO

## 2020-04-09 ENCOUNTER — Other Ambulatory Visit: Payer: Self-pay

## 2020-04-09 VITALS — BP 132/76 | HR 56 | Temp 98.5°F | Resp 17

## 2020-04-09 DIAGNOSIS — D500 Iron deficiency anemia secondary to blood loss (chronic): Secondary | ICD-10-CM | POA: Diagnosis not present

## 2020-04-09 MED ORDER — DIPHENHYDRAMINE HCL 25 MG PO CAPS
25.0000 mg | ORAL_CAPSULE | Freq: Once | ORAL | Status: AC
  Administered 2020-04-09: 25 mg via ORAL

## 2020-04-09 MED ORDER — DIPHENHYDRAMINE HCL 25 MG PO CAPS
ORAL_CAPSULE | ORAL | Status: AC
  Filled 2020-04-09: qty 1

## 2020-04-09 MED ORDER — ACETAMINOPHEN 325 MG PO TABS
650.0000 mg | ORAL_TABLET | Freq: Once | ORAL | Status: AC
  Administered 2020-04-09: 650 mg via ORAL

## 2020-04-09 MED ORDER — SODIUM CHLORIDE 0.9 % IV SOLN
Freq: Once | INTRAVENOUS | Status: AC
  Filled 2020-04-09: qty 250

## 2020-04-09 MED ORDER — SODIUM CHLORIDE 0.9 % IV SOLN
300.0000 mg | Freq: Once | INTRAVENOUS | Status: AC
  Administered 2020-04-09: 300 mg via INTRAVENOUS
  Filled 2020-04-09: qty 10

## 2020-04-09 MED ORDER — ACETAMINOPHEN 325 MG PO TABS
ORAL_TABLET | ORAL | Status: AC
  Filled 2020-04-09: qty 2

## 2020-04-09 NOTE — Patient Instructions (Signed)

## 2020-04-16 ENCOUNTER — Other Ambulatory Visit: Payer: Self-pay

## 2020-04-16 ENCOUNTER — Inpatient Hospital Stay: Payer: BC Managed Care – PPO | Attending: Physician Assistant

## 2020-04-16 VITALS — BP 131/68 | HR 56 | Temp 98.8°F | Resp 18

## 2020-04-16 DIAGNOSIS — D500 Iron deficiency anemia secondary to blood loss (chronic): Secondary | ICD-10-CM | POA: Diagnosis present

## 2020-04-16 DIAGNOSIS — N920 Excessive and frequent menstruation with regular cycle: Secondary | ICD-10-CM | POA: Diagnosis present

## 2020-04-16 MED ORDER — SODIUM CHLORIDE 0.9 % IV SOLN
300.0000 mg | Freq: Once | INTRAVENOUS | Status: AC
  Administered 2020-04-16: 300 mg via INTRAVENOUS
  Filled 2020-04-16: qty 10

## 2020-04-16 MED ORDER — DIPHENHYDRAMINE HCL 25 MG PO CAPS
ORAL_CAPSULE | ORAL | Status: AC
  Filled 2020-04-16: qty 1

## 2020-04-16 MED ORDER — SODIUM CHLORIDE 0.9 % IV SOLN
Freq: Once | INTRAVENOUS | Status: AC
  Filled 2020-04-16: qty 250

## 2020-04-16 MED ORDER — ACETAMINOPHEN 325 MG PO TABS
650.0000 mg | ORAL_TABLET | Freq: Once | ORAL | Status: AC
  Administered 2020-04-16: 650 mg via ORAL

## 2020-04-16 MED ORDER — ACETAMINOPHEN 325 MG PO TABS
ORAL_TABLET | ORAL | Status: AC
  Filled 2020-04-16: qty 2

## 2020-04-16 MED ORDER — DIPHENHYDRAMINE HCL 25 MG PO CAPS
25.0000 mg | ORAL_CAPSULE | Freq: Once | ORAL | Status: AC
  Administered 2020-04-16: 25 mg via ORAL

## 2020-04-16 NOTE — Patient Instructions (Signed)

## 2020-05-08 ENCOUNTER — Other Ambulatory Visit: Payer: Self-pay

## 2020-05-08 ENCOUNTER — Inpatient Hospital Stay: Payer: BC Managed Care – PPO | Admitting: Internal Medicine

## 2020-05-08 ENCOUNTER — Inpatient Hospital Stay: Payer: BC Managed Care – PPO

## 2020-05-08 VITALS — BP 130/76 | HR 86 | Temp 97.0°F | Resp 19 | Ht 66.0 in | Wt 233.8 lb

## 2020-05-08 DIAGNOSIS — I10 Essential (primary) hypertension: Secondary | ICD-10-CM

## 2020-05-08 DIAGNOSIS — D500 Iron deficiency anemia secondary to blood loss (chronic): Secondary | ICD-10-CM

## 2020-05-08 DIAGNOSIS — D649 Anemia, unspecified: Secondary | ICD-10-CM | POA: Diagnosis not present

## 2020-05-08 LAB — IRON AND TIBC
Iron: 57 ug/dL (ref 41–142)
Saturation Ratios: 15 % — ABNORMAL LOW (ref 21–57)
TIBC: 369 ug/dL (ref 236–444)
UIBC: 312 ug/dL (ref 120–384)

## 2020-05-08 LAB — CBC WITH DIFFERENTIAL (CANCER CENTER ONLY)
Abs Immature Granulocytes: 0.01 10*3/uL (ref 0.00–0.07)
Basophils Absolute: 0 10*3/uL (ref 0.0–0.1)
Basophils Relative: 1 %
Eosinophils Absolute: 0 10*3/uL (ref 0.0–0.5)
Eosinophils Relative: 0 %
HCT: 34.8 % — ABNORMAL LOW (ref 36.0–46.0)
Hemoglobin: 11.3 g/dL — ABNORMAL LOW (ref 12.0–15.0)
Immature Granulocytes: 0 %
Lymphocytes Relative: 19 %
Lymphs Abs: 0.5 10*3/uL — ABNORMAL LOW (ref 0.7–4.0)
MCH: 27.5 pg (ref 26.0–34.0)
MCHC: 32.5 g/dL (ref 30.0–36.0)
MCV: 84.7 fL (ref 80.0–100.0)
Monocytes Absolute: 0.3 10*3/uL (ref 0.1–1.0)
Monocytes Relative: 10 %
Neutro Abs: 1.9 10*3/uL (ref 1.7–7.7)
Neutrophils Relative %: 70 %
Platelet Count: 212 10*3/uL (ref 150–400)
RBC: 4.11 MIL/uL (ref 3.87–5.11)
RDW: 17.5 % — ABNORMAL HIGH (ref 11.5–15.5)
WBC Count: 2.8 10*3/uL — ABNORMAL LOW (ref 4.0–10.5)
nRBC: 0 % (ref 0.0–0.2)

## 2020-05-08 LAB — FERRITIN: Ferritin: 223 ng/mL (ref 11–307)

## 2020-05-08 NOTE — Progress Notes (Signed)
[facility] Telephone:(336) [phone]   Fax:([phone]  OFFICE PROGRESS NOTE  Drosinis, Leonia Reader, PA-C [address] [place] [address]  DIAGNOSIS: Iron deficiency anemia secondary to menorrhagia.  PRIOR THERAPY: Iron infusion with Venofer 300 Mg IV weekly for 3 weeks.  CURRENT THERAPY: None.  INTERVAL HISTORY: Mary Richardson 50 y.o. female returns to the clinic today for follow-up visit.  The patient is feeling fine today with no concerning complaints except for mild fatigue.  Her stamina is better after the iron infusion.  She denied having any dizzy spells.  She denied having any chest pain, shortness of breath, cough or hemoptysis.  She denied having any fever or chills.  She has no nausea, vomiting, diarrhea or constipation.  She denied having any headache or visual changes.  She is here today for evaluation with repeat CBC, iron study and ferritin.  MEDICAL HISTORY: Past Medical History:  Diagnosis Date  . Anemia   . Asthma    no issues as adult  . Gestational diabetes   . Headache(784.0)   . Hepatitis B   . Hypercholesteremia   . Hypertension    during pregnancy  . Migraine   . Osteoarthritis   . Tonsillitis, chronic     ALLERGIES:  has No Known Allergies.  MEDICATIONS:  Current Outpatient Medications  Medication Sig Dispense Refill  . hydrOXYzine (ATARAX/VISTARIL) 25 MG tablet 1 tab(s)    . norethindrone (MICRONOR) 0.35 MG tablet Take 1 tablet by mouth daily.     No current facility-administered medications for this visit.    SURGICAL HISTORY:  Past Surgical History:  Procedure Laterality Date  . NO PAST SURGERIES    . TONSILLECTOMY N/A 06/30/2017   Procedure: TONSILLECTOMY;  Surgeon: Drema Halon, MD;  Location: [facility];  Service: ENT;  Laterality: N/A;    REVIEW OF SYSTEMS:  A comprehensive review of systems was negative except for: Constitutional: positive for fatigue   PHYSICAL EXAMINATION: General  appearance: alert, cooperative, fatigued and no distress Head: Normocephalic, without obvious abnormality, atraumatic Neck: no adenopathy, no JVD, supple, symmetrical, trachea midline and thyroid not enlarged, symmetric, no tenderness/mass/nodules Lymph nodes: Cervical, supraclavicular, and axillary nodes normal. Resp: clear to auscultation bilaterally Back: symmetric, no curvature. ROM normal. No CVA tenderness. Cardio: regular rate and rhythm, S1, S2 normal, no murmur, click, rub or gallop GI: soft, non-tender; bowel sounds normal; no masses,  no organomegaly Extremities: extremities normal, atraumatic, no cyanosis or edema  ECOG PERFORMANCE STATUS: 1 - Symptomatic but completely ambulatory  Blood pressure 130/76, pulse 86, temperature (!) 97 F (36.1 C), temperature source Tympanic, resp. rate 19, height 5\' 6"  (1.676 m), weight 233 lb 12.8 oz (106.1 kg), SpO2 99 %.  LABORATORY DATA: Lab Results  Component Value Date   WBC 2.8 (L) 05/08/2020   HGB 11.3 (L) 05/08/2020   HCT 34.8 (L) 05/08/2020   MCV 84.7 05/08/2020   PLT 212 05/08/2020      Chemistry      Component Value Date/Time   NA 136 03/20/2020 1223   K 3.8 03/20/2020 1223   CL 105 03/20/2020 1223   CO2 25 03/20/2020 1223   BUN 12 03/20/2020 1223   CREATININE 0.70 03/20/2020 1223      Component Value Date/Time   CALCIUM 9.1 03/20/2020 1223   ALKPHOS 88 03/20/2020 1223   AST 14 (L) 03/20/2020 1223   ALT 16 03/20/2020 1223   BILITOT 0.4 03/20/2020 1223  RADIOGRAPHIC STUDIES: No results found.  ASSESSMENT AND PLAN: This is a very pleasant 50 years old African-American female with history of iron deficiency anemia secondary to blood loss from menorrhagia.  The patient is status post iron infusion with Venofer 300 mg IV weekly for 3 weeks and tolerated the infusion well. She had repeat CBC today that showed improvement of her hemoglobin and hematocrit.  Her hemoglobin is currently 11.3 compared to 9.1.  The  iron study and ferritin are still pending. I recommended for the patient to continue on observation for now with repeat CBC, iron study and ferritin in 3 months but if the pending iron studies showed significant iron deficiency, I will arrange for the patient to receive iron infusion in the interval. The patient was advised to call immediately if she has any concerning symptoms in the interval. The patient voices understanding of current disease status and treatment options and is in agreement with the current care plan.  All questions were answered. The patient knows to call the clinic with any problems, questions or concerns. We can certainly see the patient much sooner if necessary.  The total time spent in the appointment was 20 minutes.  Disclaimer: This note was dictated with voice recognition software. Similar sounding words can inadvertently be transcribed and may not be corrected upon review.

## 2020-05-10 ENCOUNTER — Telehealth: Payer: Self-pay | Admitting: Internal Medicine

## 2020-05-10 NOTE — Telephone Encounter (Signed)
Scheduled per los. Called and spoke with patient. Confirmed appt 

## 2020-08-07 ENCOUNTER — Other Ambulatory Visit: Payer: BC Managed Care – PPO

## 2020-08-07 ENCOUNTER — Ambulatory Visit: Payer: BC Managed Care – PPO | Admitting: Internal Medicine

## 2020-11-19 ENCOUNTER — Other Ambulatory Visit: Payer: Self-pay

## 2020-11-19 ENCOUNTER — Emergency Department (HOSPITAL_BASED_OUTPATIENT_CLINIC_OR_DEPARTMENT_OTHER)
Admission: EM | Admit: 2020-11-19 | Discharge: 2020-11-19 | Disposition: A | Discharge status: Home / Self Care | Payer: BC Managed Care – PPO | Attending: Emergency Medicine | Admitting: Emergency Medicine

## 2020-11-19 ENCOUNTER — Encounter (HOSPITAL_BASED_OUTPATIENT_CLINIC_OR_DEPARTMENT_OTHER): Payer: Self-pay | Admitting: *Deleted

## 2020-11-19 DIAGNOSIS — E119 Type 2 diabetes mellitus without complications: Secondary | ICD-10-CM | POA: Diagnosis not present

## 2020-11-19 DIAGNOSIS — J45909 Unspecified asthma, uncomplicated: Secondary | ICD-10-CM | POA: Insufficient documentation

## 2020-11-19 DIAGNOSIS — J029 Acute pharyngitis, unspecified: Secondary | ICD-10-CM | POA: Insufficient documentation

## 2020-11-19 LAB — GROUP A STREP BY PCR: Group A Strep by PCR: NOT DETECTED

## 2020-11-19 MED ORDER — LIDOCAINE VISCOUS HCL 2 % MT SOLN: 5.0000 mL | Freq: Three times a day (TID) | OROMUCOSAL | 0 refills | Status: DC

## 2020-11-19 MED ORDER — BENZONATATE 100 MG PO CAPS: 100.0000 mg | ORAL_CAPSULE | Freq: Three times a day (TID) | ORAL | 0 refills | Status: DC

## 2020-11-19 NOTE — ED Provider Notes (Signed)
[facility] EMERGENCY DEPARTMENT Provider Note   CSN: [id number] Arrival date & time: 11/19/20  1732     History Chief Complaint  Patient presents with   Sore Throat    Mary Richardson is a 50 y.o. female presents to the ED today with complaint of gradual onset, constant, achy, sore throat for the past week.  She is also been complaining of a dry cough and chest congestion.  She states that she has taken Tylenol and drink warm teas without significant relief.  Her sore throat was so sore today prompting ED visit.  She has not tried any ibuprofen for same.  She denies any fevers or chills.  Denies any recent sick contacts.  Is still tolerating her own secretions without difficulty however does admit to some pain with same.  Denies any voice change.  The history is provided by the patient and medical records.      Past Medical History:  Diagnosis Date   Anemia    Asthma    no issues as adult   Gestational diabetes    Headache(784.0)    Hepatitis B    Hypercholesteremia    Hypertension    during pregnancy   Migraine    Osteoarthritis    Tonsillitis, chronic     Patient Active Problem List   Diagnosis Date Noted   Iron deficiency anemia due to chronic blood loss 03/20/2020   Ovarian cyst 07/28/2017   Bilateral carpal tunnel syndrome 04/19/2017   Trigger finger, acquired 02/06/2017   OA (osteoarthritis) of knee 12/06/2016   Vitamin D deficiency 03/13/2016   Normochromic normocytic anemia 03/13/2016   Benign essential hypertension 03/13/2016   Type 2 diabetes mellitus without complication, without long-term current use of insulin (HCC) 08/28/2015   Hyperlipidemia 08/28/2015   Non morbid obesity due to excess calories 02/19/2015   Gestational diabetes mellitus in childbirth 01/11/2013    Past Surgical History:  Procedure Laterality Date   NO PAST SURGERIES     TONSILLECTOMY N/A 06/30/2017   Procedure: TONSILLECTOMY;  Surgeon: Drema Halon, MD;  Location:  [facility];  Service: ENT;  Laterality: N/A;   TONSILLECTOMY       OB History     Gravida  2   Para  2   Term  1   Preterm  1   AB      Living  2      SAB      IAB      Ectopic      Multiple      Live Births  2           Family History  Problem Relation Age of Onset   Diabetes Father    Hypertension Father    Stroke Father    Asthma Brother    Asthma Son    Hearing loss Maternal Aunt    Alcohol abuse Neg Hx    Arthritis Neg Hx    Birth defects Neg Hx    Cancer Neg Hx    COPD Neg Hx    Depression Neg Hx    Drug abuse Neg Hx    Early death Neg Hx    Heart disease Neg Hx    Hyperlipidemia Neg Hx    Kidney disease Neg Hx    Learning disabilities Neg Hx    Mental illness Neg Hx    Mental retardation Neg Hx    Miscarriages / Stillbirths Neg Hx    Vision loss  Neg Hx     Social History   Tobacco Use   Smoking status: Never   Smokeless tobacco: Never  Vaping Use   Vaping Use: Never used  Substance Use Topics   Alcohol use: No   Drug use: No    Home Medications Prior to Admission medications   Medication Sig Start Date End Date Taking? Authorizing Provider  benzonatate (TESSALON) 100 MG capsule Take 1 capsule (100 mg total) by mouth every 8 (eight) hours. 11/19/20  Yes Clifton Safley, PA-C  hydrOXYzine (ATARAX/VISTARIL) 25 MG tablet 1 tab(s)    [provider]  magic mouthwash (lidocaine, diphenhydrAMINE, alum & mag hydroxide) suspension Swish and swallow 5 mLs 3 (three) times daily. 11/19/20  Yes Sherion Dooly, PA-C  norethindrone (MICRONOR) 0.35 MG tablet Take 1 tablet by mouth daily. 06/07/19   [provider]    Allergies    Patient has no known allergies.  Review of Systems   Review of Systems  Constitutional:  Negative for chills and fever.  HENT:  Positive for sore throat. Negative for trouble swallowing and voice change.   Respiratory:  Positive for cough. Negative for shortness of breath.   All  other systems reviewed and are negative.  Physical Exam Updated Vital Signs BP (!) 159/110 (BP Location: Right Arm)   Pulse 80   Temp 98.3 F (36.8 C) (Oral)   Resp (!) 24   Ht 5\' 6"  (1.676 m)   Wt 104.3 kg   LMP 10/31/2020   SpO2 99%   BMI 37.12 kg/m   Physical Exam Vitals and nursing note reviewed.  Constitutional:      Appearance: She is not ill-appearing.  HENT:     Head: Normocephalic and atraumatic.     Mouth/Throat:     Mouth: Mucous membranes are moist.     Pharynx: Uvula midline. Pharyngeal swelling and posterior oropharyngeal erythema present. No oropharyngeal exudate.     Comments: Phonating normally. No drooling.  Eyes:     Conjunctiva/sclera: Conjunctivae normal.  Cardiovascular:     Rate and Rhythm: Normal rate and regular rhythm.  Pulmonary:     Effort: Pulmonary effort is normal.     Breath sounds: Normal breath sounds. No wheezing, rhonchi or rales.     Comments: Actively coughing. Speaking in full sentences without difficulty. Satting 99% on RA.  Skin:    General: Skin is warm and dry.     Coloration: Skin is not jaundiced.  Neurological:     Mental Status: She is alert.    ED Results / Procedures / Treatments   Labs (all labs ordered are listed, but only abnormal results are displayed) Labs Reviewed  GROUP A STREP BY PCR    EKG None  Radiology No results found.  Procedures Procedures   Medications Ordered in ED Medications - No data to display  ED Course  I have reviewed the triage vital signs and the nursing notes.  Pertinent labs & imaging results that were available during my care of the patient were reviewed by me and considered in my medical decision making (see chart for details).    MDM Rules/Calculators/A&P                           50 year old female who presents to the ED today with complaint of sore throat for the past week as well as a cough.  No fevers.  On arrival to the ED vitals are stable.  Patient had strep  test done while in the waiting room which has returned negative.  On my exam she is resting comfortably and appears to be in no acute distress.  Phonating normally.  No drooling.  She is noted to have some posterior oropharyngeal erythema and edema without exudate.  Uvula is midline.  Exam does not seem consistent with deep space infection.  No signs of PTA.  Her lungs are clear to auscultation bilaterally.  Given she has been symptomatic for a week I do not feel she requires flu testing at this time as she would be out of the window for Tamiflu.  We will plan to discharge home with Magic mouthwash as well as instructions to take ibuprofen for pain.  Also prescribed Tessalon Perles for her cough.  She is instructed to follow-up with her PCP for further evaluation.  She is in agreement with plan at this time and stable for discharge.  This note was prepared using Dragon voice recognition software and may include unintentional dictation errors due to the inherent limitations of voice recognition software.   Final Clinical Impression(s) / ED Diagnoses Final diagnoses:  Viral pharyngitis    Rx / DC Orders ED Discharge Orders          Ordered    magic mouthwash (lidocaine, diphenhydrAMINE, alum & mag hydroxide) suspension  3 times daily        11/19/20 1945    benzonatate (TESSALON) 100 MG capsule  Every 8 hours        11/19/20 1945             Discharge Instructions      Please pick up medication and take as prescribed. Take 600-800 mg Ibuprofen (3-4 OTC tablets) as needed for pain. Continue with warm teas with honey to help alleviate your sore throat.   Follow up with your PCP regarding ED visit  Return to the ED for any new/worsening symptoms       Tanda Rockers, PA-C 11/19/20 1949    Charlynne Pander, MD 11/19/20 2337

## 2020-11-19 NOTE — ED Triage Notes (Signed)
C/o sore throat  x 4 days.

## 2020-11-19 NOTE — Discharge Instructions (Signed)
Please pick up medication and take as prescribed. Take 600-800 mg Ibuprofen (3-4 OTC tablets) as needed for pain. Continue with warm teas with honey to help alleviate your sore throat.   Follow up with your PCP regarding ED visit  Return to the ED for any new/worsening symptoms

## 2021-04-08 ENCOUNTER — Encounter: Payer: Self-pay | Admitting: Registered"

## 2021-04-08 ENCOUNTER — Other Ambulatory Visit: Payer: Self-pay

## 2021-04-08 ENCOUNTER — Encounter: Payer: BC Managed Care – PPO | Attending: Physician Assistant | Admitting: Registered"

## 2021-04-08 VITALS — Ht 66.0 in | Wt 226.4 lb

## 2021-04-08 DIAGNOSIS — E119 Type 2 diabetes mellitus without complications: Secondary | ICD-10-CM | POA: Insufficient documentation

## 2021-04-08 NOTE — Patient Instructions (Addendum)
MBSR course (Mindfulness-based stress reduction free 8-week course) https://palousemindfulness.[email or web address]/ ? ?Checking blood sugar for instant feedback. Time you might want to check are: ?Fasting, 1-2 hours after eating, bedtime. ?With your A1c of 6.1% I would expect fasting to be ~100-115 mg/dL ?1-2 hours after eating ~160 mg/dL ? ?Some things besides food that might affect your blood sugar: ?Stress ?Not enough restful sleep ?Exercise can help bring it down. ? ?Goals ?Exercise: going 5x/week 30 minutes cycling 15 min treadmill and stretching as well ?Stress management: contact your therapist and start regular counseling. ?Sleep: Eat dinner no later than 7:30 pm, reduce the amount of TV watching. ? ?

## 2021-04-08 NOTE — Progress Notes (Signed)
Diabetes Self-Management Education ? ?Visit Type: First/Initial ? ?Appt. Start Time: 0900 Appt. End Time: [id number] ? ?04/08/2021 ? ?Ms. Mary Richardson, identified by name and date of birth, is a 51 y.o. female with a diagnosis of Diabetes: Type 2.  ? ?ASSESSMENT ? ?Height 5\' 6"  (1.676 m), weight 226 lb 6.4 oz (102.7 kg). ?Body mass index is 36.54 kg/m?. ? ?A1c 6.1% ?Medication: none - could not tolerate metformin. ? ?Pt states since October she reduced he meal size, stopped eating when full, lost 8 lbs and brought down her A1c without medication. Although A1c looks good, patient may be having some blood sugar excursions based on Fasting number today of 130 mg/dL. ? ?Pt states her goal is to lose a little more weight to be at a "healthy weight" and also be healthy on the inside. Also wants to avoid being anemic again, has history of iron infusions. ? ?Sleeping 7-8 hrs. Sometimes good sleep. watches TV before bed, TV in bedroom. ? ?Stress: 7/10; feels a lot of stress. Depression and anxiety due to sisters death a few years ago. Small stuff can trigger anxiety and sometimes gets overwhelmed. 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  ? ?Physical activity: Works at [employer], very active at work. Due to L knee replacement surgery has just joined gym, planning on going weekdays after dropping children off.  ? ?Diet: Fasting until the 21st of next month. ? ?Usual diet when not participating in Ramadan fasting ?2 meals per day, just snacks in the afternoon ?B: 1 egg, toast, butter OR oatmeal, granola yogurt OR [employer] sesame bagel, butter, coffee with sugar and cream ?S: peanut, cashew, or chips, water ?L: none ?D: 7-8pm: rice, fish or meat OR salad with grilled chicken or fried shrimp. Or rice porridge with milk, sugar and cinnamon ? ?Provided Contour Next EZ ?Lot [id number] ?Exp 08/12/21 ?CBG: 130 mg/dL (fasting) ? ? Diabetes Self-Management Education - 04/08/21 0913   ? ?  ? Visit  Information  ? Visit Type First/Initial   ?  ? Initial Visit  ? Diabetes Type Type 2   ? Are you currently following a meal plan? No   ? Date Diagnosed 2017 & history of GMD managed with glyburide with 2014   ?  ? Health Coping  ? How would you rate your overall health? Good   ?  ? Psychosocial Assessment  ? Patient Belief/Attitude about Diabetes Afraid   ? How often do you need to have someone help you when you read instructions, pamphlets, or other written materials from your doctor or pharmacy? 1 - Never   ? What is the last grade level you completed in school? 12   ?  ? Complications  ? Last HgB A1C per patient/outside source 6.1 %   ? How often do you check your blood sugar? 0 times/day (not testing)   ? Have you had a dilated eye exam in the past 12 months? Yes   ? Have you had a dental exam in the past 12 months? Yes   ? Are you checking your feet? No   ?  ? Dietary Intake  ? Breakfast no food during the day for Ramadan fast   ? Dinner (breaking fast at 7:40) 2 dates, hot coffee  5 min later egg, 1/2 c rice, avocado, meat   ? Snack (evening) none   ? Beverage(s) water: 1 bottle when fasting 2- 2.5 bottles when not fasting, coffee, 1  c sleepy-time tea nightly   ?  ? Exercise  ? Exercise Type ADL's   has just joined gym, planning on going 5x/week 45-60 cycling  ?  ? Patient Education  ? Previous Diabetes Education Yes (please comment)   GDM 2014  ? Disease state  Definition of diabetes, type 1 and 2, and the diagnosis of diabetes   ? Physical activity and exercise  Role of exercise on diabetes management, blood pressure control and cardiac health.   ? Medications Reviewed patients medication for diabetes, action, purpose, timing of dose and side effects.   ? Monitoring Purpose and frequency of SMBG.;Identified appropriate SMBG and/or A1C goals.   ? Psychosocial adjustment Role of stress on diabetes   ?  ? Individualized Goals (developed by patient)  ? Nutrition Other (comment)   only 1 date to break nightly  Ramadan fast  ? Physical Activity Exercise 3-5 times per week   ? Monitoring  test my blood glucose as discussed   ? Health Coping Other (comment)   start back with therapist  ?  ? Outcomes  ? Expected Outcomes Demonstrated interest in learning. Expect positive outcomes   ? Future DMSE 4-6 wks   ? Program Status Not Completed   ? ?  ?  ? ?  ? ? ?Individualized Plan for Diabetes Self-Management Training:  ? ?Learning Objective:  Patient will have a greater understanding of diabetes self-management. ?Patient education plan is to attend individual and/or group sessions per assessed needs and concerns. ? ? ?Patient Instructions  ?MBSR course (Mindfulness-based stress reduction free 8-week course) https://palousemindfulness.com/ ? ?Checking blood sugar for instant feedback. Time you might want to check are: ?Fasting, 1-2 hours after eating, bedtime. ?With your A1c of 6.1% I would expect fasting to be ~100-115 mg/dL ?1-2 hours after eating ~160 mg/dL ? ?Some things besides food that might affect your blood sugar: ?Stress ?Not enough restful sleep ?Exercise can help bring it down. ? ?Goals ?Exercise: going 5x/week 30 minutes cycling 15 min treadmill and stretching as well ?Stress management: contact your therapist and start regular counseling. ?Sleep: Eat dinner no later than 7:30 pm, reduce the amount of TV watching. ? ? ?Expected Outcomes:  Demonstrated interest in learning. Expect positive outcomes ? ?Education material provided: ADA - How to Thrive: A Guide for Your Journey with Diabetes and A1C conversion sheet, sleep hygiene ? ?If problems or questions, patient to contact team via:  Phone and MyChart ? ?Future DSME appointment: 4-6 wks ?

## 2021-05-29 ENCOUNTER — Ambulatory Visit: Payer: BC Managed Care – PPO | Admitting: Registered"

## 2021-08-05 ENCOUNTER — Ambulatory Visit: Payer: BC Managed Care – PPO | Admitting: Registered"

## 2022-04-28 ENCOUNTER — Encounter: Payer: Self-pay | Admitting: *Deleted

## 2023-07-27 ENCOUNTER — Inpatient Hospital Stay: Attending: Hematology and Oncology

## 2023-07-27 ENCOUNTER — Inpatient Hospital Stay: Admitting: Hematology and Oncology

## 2023-07-27 VITALS — BP 116/64 | HR 79 | Temp 98.1°F | Resp 13 | Wt 195.5 lb

## 2023-07-27 DIAGNOSIS — D500 Iron deficiency anemia secondary to blood loss (chronic): Secondary | ICD-10-CM | POA: Insufficient documentation

## 2023-07-27 DIAGNOSIS — N920 Excessive and frequent menstruation with regular cycle: Secondary | ICD-10-CM | POA: Insufficient documentation

## 2023-07-27 LAB — FERRITIN: Ferritin: 15 ng/mL (ref 11–307)

## 2023-07-27 LAB — IRON AND IRON BINDING CAPACITY (CC-WL,HP ONLY)
Iron: 67 ug/dL (ref 28–170)
Saturation Ratios: 14 % (ref 10.4–31.8)
TIBC: 493 ug/dL — ABNORMAL HIGH (ref 250–450)
UIBC: 426 ug/dL (ref 148–442)

## 2023-07-27 LAB — RETIC PANEL
Immature Retic Fract: 7.9 % (ref 2.3–15.9)
RBC.: 3.56 MIL/uL — ABNORMAL LOW (ref 3.87–5.11)
Retic Count, Absolute: 39.2 K/uL (ref 19.0–186.0)
Retic Ct Pct: 1.1 % (ref 0.4–3.1)
Reticulocyte Hemoglobin: 31.9 pg (ref >27.9)

## 2023-07-27 LAB — FOLATE: Folate: 7.5 ng/mL (ref >5.9)

## 2023-07-27 LAB — CBC WITH DIFFERENTIAL (CANCER CENTER ONLY)
Abs Immature Granulocytes: 0.01 K/uL (ref 0.00–0.07)
Basophils Absolute: 0.1 K/uL (ref 0.0–0.1)
Basophils Relative: 2 %
Eosinophils Absolute: 0.1 K/uL (ref 0.0–0.5)
Eosinophils Relative: 3 %
HCT: 31.2 % — ABNORMAL LOW (ref 36.0–46.0)
Hemoglobin: 10.4 g/dL — ABNORMAL LOW (ref 12.0–15.0)
Immature Granulocytes: 0 %
Lymphocytes Relative: 26 %
Lymphs Abs: 1 K/uL (ref 0.7–4.0)
MCH: 29 pg (ref 26.0–34.0)
MCHC: 33.3 g/dL (ref 30.0–36.0)
MCV: 86.9 fL (ref 80.0–100.0)
Monocytes Absolute: 0.4 K/uL (ref 0.1–1.0)
Monocytes Relative: 9 %
Neutro Abs: 2.3 K/uL (ref 1.7–7.7)
Neutrophils Relative %: 60 %
Platelet Count: 296 K/uL (ref 150–400)
RBC: 3.59 MIL/uL — ABNORMAL LOW (ref 3.87–5.11)
RDW: 12.8 % (ref 11.5–15.5)
WBC Count: 3.9 K/uL — ABNORMAL LOW (ref 4.0–10.5)
nRBC: 0 % (ref 0.0–0.2)

## 2023-07-27 LAB — CMP (CANCER CENTER ONLY)
ALT: 13 U/L (ref 0–44)
AST: 13 U/L — ABNORMAL LOW (ref 15–41)
Albumin: 3.7 g/dL (ref 3.5–5.0)
Alkaline Phosphatase: 85 U/L (ref 38–126)
Anion gap: 4 — ABNORMAL LOW (ref 5–15)
BUN: 12 mg/dL (ref 6–20)
CO2: 27 mmol/L (ref 22–32)
Calcium: 9 mg/dL (ref 8.9–10.3)
Chloride: 107 mmol/L (ref 98–111)
Creatinine: 0.52 mg/dL (ref 0.44–1.00)
GFR, Estimated: >60 mL/min (ref >60)
Glucose, Bld: 92 mg/dL (ref 70–99)
Potassium: 3.6 mmol/L (ref 3.5–5.1)
Sodium: 138 mmol/L (ref 135–145)
Total Bilirubin: 0.6 mg/dL (ref 0.0–1.2)
Total Protein: 7.1 g/dL (ref 6.5–8.1)

## 2023-07-27 LAB — VITAMIN B12: Vitamin B-12: 457 pg/mL (ref 180–914)

## 2023-07-27 NOTE — Progress Notes (Signed)
 [facility] [phone]   [phone]  INITIAL CONSULT NOTE  Patient Care Team: Catalina Bare, MD as PCP - General (Internal Medicine)  Hematological/Oncological History # Iron  Deficiency Anemia 2/2 to GYN Bleeding 05/08/2020: WBC 2.8, Hgb 11.3, MCV 84.7, Plt 212. Ferritin 223.  06/16/2023: WBC 3.7, Hgb 10.7, MCV 89.9, Plt 294.  07/27/2023: establish care with Dr. Federico   CHIEF COMPLAINTS/PURPOSE OF CONSULTATION:  Iron  Deficiency Anemia   HISTORY OF PRESENTING ILLNESS:  Mary Richardson 53 y.o. female with medical history significant for gestational diabetes, hepatitis B, hypertension in pregnancy, migraine, and osteoarthritis who presents for evaluation of iron  deficiency anemia in the setting of heavy GYN bleeding.   On review of the previous records Mary Richardson was seen in our clinic in 2022 to boost her iron  levels prior to her knee surgery.  She received 3 doses of Venofer  300 mg IV.  Additionally her last labs on 06/16/2023 showed white blood cell 3.7, Hgb 10.7, MCV 89.9, Plt 294.  Due to concern for her iron  deficiency anemia while on p.o. iron  therapy she was referred to hematology for further evaluation and management.  On exam today Mary Richardson ( pronounced GOM) reports that she has had issues with anemia for a long long time.  She reports that has been happening since childhood and that there is a family history of anemia.  She reports that she has long menstrual cycles that can last for up to 10 days to several weeks.  She reports she does have a lot of dizziness and occasional tiredness.  She reports that she has been taking iron  pills and tolerating them well.  She reports that in 2022 prior to a knee surgery she had IV iron  therapy and tolerated it well other than some nausea.  She reports today her energy is about a 6 out of 10.  She reports she does enjoy eating red meat approximately once per week.  She is otherwise unrestricted diet.  She notes  that she does have nosebleeds but no blood in the urine or stool.  She reports that she often takes about 2 tissues to stop the nosebleeds.  She has had no recent illnesses with fevers, chills, sweats.  On further discussion she reports her sister also has anemia in the same problem.  Her mother has hypertension and her dad has type 2 diabetes.  She has 2 healthy children.  She is a non-smoker nondrinker and currently works for [employer].  She moved [place]  in 2001 from [place].  She reports that she does not visit very often.  She notes nothing else out of the ordinary in the interim since her last visit in 2022.  She reports that she is working with OB/GYN to better control her menstrual cycles.  A full 10 point ROS is otherwise negative.  MEDICAL HISTORY:  Past Medical History:  Diagnosis Date   Anemia    Asthma    no issues as adult   Gestational diabetes    Headache(784.0)    Hepatitis B    Hypercholesteremia    Hypertension    during pregnancy   Migraine    Osteoarthritis    Tonsillitis, chronic     SURGICAL HISTORY: Past Surgical History:  Procedure Laterality Date   NO PAST SURGERIES     TONSILLECTOMY N/A 06/30/2017   Procedure: TONSILLECTOMY;  Surgeon: Ethyl Lonni BRAVO, MD;  Location: [facility];  Service: ENT;  Laterality: N/A;   TONSILLECTOMY  SOCIAL HISTORY: Social History   Socioeconomic History   Marital status: Single    Spouse name: Not on file   Number of children: Not on file   Years of education: Not on file   Highest education level: Not on file  Occupational History   Not on file  Tobacco Use   Smoking status: Never   Smokeless tobacco: Never  Vaping Use   Vaping status: Never Used  Substance and Sexual Activity   Alcohol use: No   Drug use: No   Sexual activity: Not Currently    Birth control/protection: Pill  Other Topics Concern   Not on file  Social History Narrative   Not on file   Social Drivers of Health    Financial Resource Strain: Not on file  Food Insecurity: No Food Insecurity (07/27/2023)   Hunger Vital Sign    Worried About Running Out of Food in the Last Year: Never true    Ran Out of Food in the Last Year: Never true  Transportation Needs: No Transportation Needs (07/27/2023)   PRAPARE - Administrator, Civil Service (Medical): No    Lack of Transportation (Non-Medical): No  Physical Activity: Not on file  Stress: Not on file  Social Connections: Not on file  Intimate Partner Violence: Not At Risk (07/27/2023)   Humiliation, Afraid, Rape, and Kick questionnaire    Fear of Current or Ex-Partner: No    Emotionally Abused: No    Physically Abused: No    Sexually Abused: No    FAMILY HISTORY: Family History  Problem Relation Age of Onset   Diabetes Father    Hypertension Father    Stroke Father    Asthma Brother    Asthma Son    Hearing loss Maternal Aunt    Alcohol abuse Neg Hx    Arthritis Neg Hx    Birth defects Neg Hx    Cancer Neg Hx    COPD Neg Hx    Depression Neg Hx    Drug abuse Neg Hx    Early death Neg Hx    Heart disease Neg Hx    Hyperlipidemia Neg Hx    Kidney disease Neg Hx    Learning disabilities Neg Hx    Mental illness Neg Hx    Mental retardation Neg Hx    Miscarriages / Stillbirths Neg Hx    Vision loss Neg Hx     ALLERGIES:  has no known allergies.  MEDICATIONS:  Current Outpatient Medications  Medication Sig Dispense Refill   Menthol , Topical Analgesic, 5 % GEL Use topically as directed for pain for 3 to 7 days as needed. Do not apply to open wounds, infections or exfoliative dermatitis.     norethindrone (MICRONOR) 0.35 MG tablet Take 1 tablet by mouth daily.     amLODipine (NORVASC) 2.5 MG tablet Take 2.5 mg by mouth daily.     ferrous sulfate  325 (65 FE) MG EC tablet Take 325 mg by mouth daily with breakfast.     losartan (COZAAR) 25 MG tablet Take 25 mg by mouth daily.     OZEMPIC, 2 MG/DOSE, 8 MG/3ML SOPN Inject 2 mg  into the skin once a week.     No current facility-administered medications for this visit.    REVIEW OF SYSTEMS:   Constitutional: ( - ) fevers, ( - )  chills , ( - ) night sweats Eyes: ( - ) blurriness of vision, ( - ) double vision, ( - )  watery eyes Ears, nose, mouth, throat, and face: ( - ) mucositis, ( - ) sore throat Respiratory: ( - ) cough, ( - ) dyspnea, ( - ) wheezes Cardiovascular: ( - ) palpitation, ( - ) chest discomfort, ( - ) lower extremity swelling Gastrointestinal:  ( - ) nausea, ( - ) heartburn, ( - ) change in bowel habits Skin: ( - ) abnormal skin rashes Lymphatics: ( - ) new lymphadenopathy, ( - ) easy bruising Neurological: ( - ) numbness, ( - ) tingling, ( - ) new weaknesses Behavioral/Psych: ( - ) mood change, ( - ) new changes  All other systems were reviewed with the patient and are negative.  PHYSICAL EXAMINATION:  Vitals:   07/27/23 0916  BP: 116/64  Pulse: 79  Resp: 13  Temp: 98.1 F (36.7 C)  SpO2: 96%   Filed Weights   07/27/23 0916  Weight: 195 lb 8 oz (88.7 kg)    GENERAL: well appearing middle-aged African American female in NAD  SKIN: skin color, texture, turgor are normal, no rashes or significant lesions EYES: conjunctiva are pink and non-injected, sclera clear LUNGS: clear to auscultation and percussion with normal breathing effort HEART: regular rate & rhythm and no murmurs and no lower extremity edema Musculoskeletal: no cyanosis of digits and no clubbing  PSYCH: alert & oriented x 3, fluent speech NEURO: no focal motor/sensory deficits  LABORATORY DATA:  I have reviewed the data as listed    Latest Ref Rng & Units 05/08/2020    9:30 AM 03/20/2020   12:23 PM 08/16/2018    1:15 PM  CBC  WBC 4.0 - 10.5 K/uL 2.8  8.3  4.8   Hemoglobin 12.0 - 15.0 g/dL 88.6  9.1  9.8   Hematocrit 36.0 - 46.0 % 34.8  30.9  33.1   Platelets 150 - 400 K/uL 212  363  291        Latest Ref Rng & Units 03/20/2020   12:23 PM 08/16/2018    1:15 PM  07/04/2017    9:49 PM  CMP  Glucose 70 - 99 mg/dL 99  89  88   BUN 6 - 20 mg/dL 12  8  9    Creatinine 0.44 - 1.00 mg/dL 9.29  9.50  9.30   Sodium 135 - 145 mmol/L 136  138  139   Potassium 3.5 - 5.1 mmol/L 3.8  4.1  3.1   Chloride 98 - 111 mmol/L 105  106  103   CO2 22 - 32 mmol/L 25  25  28    Calcium 8.9 - 10.3 mg/dL 9.1  9.2  9.1   Total Protein 6.5 - 8.1 g/dL 7.5  7.5  8.5   Total Bilirubin 0.3 - 1.2 mg/dL 0.4  0.4  0.6   Alkaline Phos 38 - 126 U/L 88  74  71   AST 15 - 41 U/L 14  19  19    ALT 0 - 44 U/L 16  16  19       ASSESSMENT & PLAN Mary Richardson 53 y.o. female with medical history significant for gestational diabetes, hepatitis B, hypertension in pregnancy, migraine, and osteoarthritis who presents for evaluation of iron  deficiency anemia in the setting of heavy GYN bleeding.   After review of the labs, review of the records, and discussion with the patient the patients findings are most consistent with IDA in setting of GYN bleeding.   # Iron  Deficiency Anemia 2/2 to GYN Bleeding -- Findings are consistent  with iron  deficiency anemia secondary to patient's menorrhagia --Encouraged her to follow-up with OB/GYN for better control of her menstrual cycles --We will confirm iron  deficiency anemia by ordering iron  panel and ferritin as well as reticulocytes, CBC, and CMP --Continue ferrous sulfate  325 mg daily with a source of vitamin C --We will plan to proceed with IV iron  therapy in order to help bolster the patient's blood counts --Plan for return to clinic in 4 to 6 weeks time after last dose of IV iron    Orders Placed This Encounter  Procedures   CBC with Differential (Cancer Center Only)    Standing Status:   Future    Number of Occurrences:   1    Expiration Date:   07/26/2024   CMP (Cancer Center only)    Standing Status:   Future    Number of Occurrences:   1    Expiration Date:   07/26/2024   Ferritin    Standing Status:   Future    Number of Occurrences:   1     Expiration Date:   07/26/2024   Iron  and Iron  Binding Capacity ([facility] only)    Standing Status:   Future    Number of Occurrences:   1    Expiration Date:   07/26/2024   Retic Panel    Standing Status:   Future    Number of Occurrences:   1    Expiration Date:   07/26/2024   Vitamin B12    Standing Status:   Future    Number of Occurrences:   1    Expiration Date:   07/26/2024   Methylmalonic acid, serum    Standing Status:   Future    Number of Occurrences:   1    Expiration Date:   07/26/2024   Folate, Serum    Standing Status:   Future    Number of Occurrences:   1    Expiration Date:   07/26/2024    All questions were answered. The patient knows to call the clinic with any problems, questions or concerns.  A total of more than 60 minutes were spent on this encounter with face-to-face time and non-face-to-face time, including preparing to see the patient, ordering tests and/or medications, counseling the patient and coordination of care as outlined above.   Norleen IVAR Kidney, MD Department of Hematology/Oncology [facility] Phone: [phone] Pager: [phone] Email: [email or web address]  07/27/2023 9:45 AM

## 2023-07-29 LAB — METHYLMALONIC ACID, SERUM: Methylmalonic Acid, Quantitative: 121 nmol/L (ref 0–378)

## 2023-08-12 ENCOUNTER — Other Ambulatory Visit: Payer: Self-pay | Admitting: Hematology and Oncology

## 2023-08-13 ENCOUNTER — Encounter (INDEPENDENT_AMBULATORY_CARE_PROVIDER_SITE_OTHER): Payer: Self-pay

## 2023-08-14 ENCOUNTER — Telehealth: Payer: Self-pay | Admitting: Pharmacy Technician

## 2023-08-14 NOTE — Telephone Encounter (Signed)
 Auth Submission: NO AUTH NEEDED Site of care: Site of care: [facility] Payer: [employer] [place]  Medication & CPT/J Code(s) submitted: Monoferric (Ferrci derisomaltose) [phone] Diagnosis Code:  Route of submission (phone, fax, portal):  Phone # Fax # Auth type: Buy/Bill PB Units/visits requested: 1000mg  x1 dose Reference number: [id number] Approval from: 08/14/23 to 11/14/23   Letter has been scanned to media tab no auth needed.

## 2023-08-17 ENCOUNTER — Ambulatory Visit (INDEPENDENT_AMBULATORY_CARE_PROVIDER_SITE_OTHER): Admitting: *Deleted

## 2023-08-17 VITALS — BP 125/69 | HR 74 | Temp 98.4°F | Resp 16 | Ht 65.0 in | Wt 198.8 lb

## 2023-08-17 DIAGNOSIS — D500 Iron deficiency anemia secondary to blood loss (chronic): Secondary | ICD-10-CM | POA: Diagnosis not present

## 2023-08-17 DIAGNOSIS — N920 Excessive and frequent menstruation with regular cycle: Secondary | ICD-10-CM

## 2023-08-17 MED ORDER — SODIUM CHLORIDE 0.9 % IV SOLN
1000.0000 mg | Freq: Once | INTRAVENOUS | Status: AC
  Administered 2023-08-17: 1000 mg via INTRAVENOUS
  Filled 2023-08-17: qty 10

## 2023-08-17 NOTE — Patient Instructions (Signed)

## 2023-08-17 NOTE — Progress Notes (Signed)
 Diagnosis: Iron  Deficiency Anemia  Provider:  Mannam, Praveen MD  Procedure: IV Infusion  IV Type: Peripheral, IV Location: L Hand  Monoferric  (Ferric Derisomaltose ), Dose: 1000 mg  Infusion Start Time: 0922 am  Infusion Stop Time: 0950 am  Post Infusion IV Care: Observation period completed  Discharge: Condition: Good, Destination: Home . AVS Provided  Performed by:  Trudy Lamarr LABOR, RN

## 2023-09-25 ENCOUNTER — Other Ambulatory Visit: Payer: Self-pay | Admitting: Obstetrics and Gynecology

## 2023-09-25 DIAGNOSIS — R928 Other abnormal and inconclusive findings on diagnostic imaging of breast: Secondary | ICD-10-CM

## 2023-09-25 DIAGNOSIS — N6489 Other specified disorders of breast: Secondary | ICD-10-CM

## 2023-10-01 ENCOUNTER — Ambulatory Visit
Admission: RE | Admit: 2023-10-01 | Discharge: 2023-10-01 | Disposition: A | Discharge status: Home / Self Care | Source: Ambulatory Visit | Attending: Obstetrics and Gynecology | Admitting: Obstetrics and Gynecology

## 2023-10-01 ENCOUNTER — Ambulatory Visit

## 2023-10-01 DIAGNOSIS — R928 Other abnormal and inconclusive findings on diagnostic imaging of breast: Secondary | ICD-10-CM

## 2023-10-01 DIAGNOSIS — N6489 Other specified disorders of breast: Secondary | ICD-10-CM

## 2023-11-12 ENCOUNTER — Ambulatory Visit (INDEPENDENT_AMBULATORY_CARE_PROVIDER_SITE_OTHER): Admitting: Otolaryngology

## 2023-11-12 ENCOUNTER — Encounter (INDEPENDENT_AMBULATORY_CARE_PROVIDER_SITE_OTHER): Payer: Self-pay | Admitting: Otolaryngology

## 2023-11-12 VITALS — BP 114/76 | HR 90 | Ht 65.0 in | Wt 194.0 lb

## 2023-11-12 DIAGNOSIS — R040 Epistaxis: Secondary | ICD-10-CM

## 2023-11-12 MED ORDER — MUPIROCIN 2 % EX OINT: 1.0000 | TOPICAL_OINTMENT | Freq: Two times a day (BID) | CUTANEOUS | 0 refills | Status: AC

## 2023-11-12 NOTE — Patient Instructions (Addendum)
 Mupirocin ointment: Apply a pea sized amount twice daily just to inside of right nostril using your pinkie finger for 7 days (apply to the nostril part, not the middle part), then pinch your nose for 10 seconds, then stop  After 7 days, use vaseline twice per day - especially before bed  Keep track of your nose bleeds

## 2023-11-12 NOTE — Progress Notes (Signed)
 Dear Dr. Rosalea, Here is my assessment for our mutual patient, Mary Richardson. Thank you for allowing me the opportunity to care for your patient. Please do not hesitate to contact me should you have any other questions. Sincerely, Dr. Eldora Blanch  Otolaryngology Clinic Note  HISTORY:  Discussed the use of AI scribe software for clinical note transcription with the patient, who gave verbal consent to proceed.  History of Present Illness [name] is a 53 year old female who presents for Epistaxis  She experiences epistaxis primarily from the right nostril, with episodes increasing in frequency over the past year, now occurring two to three times in the past week. Each episode lasts 30 seconds to a minute and resolves with pressure. Nasal dryness is present, and she uses menthol  and Vaseline for relief. Scratching or itching inside her nose can trigger bleeding, though she tries to not manipulate the nose. A steroid nasal spray was ineffective.  She has a history of anemia and has not experienced bleeding issues following surgery. There is no history of trauma to the nose, liver or kidney problems, and she does not smoke. There is no family history of bleeding disorders. Her hypertension is managed with medication, and her type 2 diabetes is managed with Ozempic.  She h/o sinus infections  ENT Surgery: Tonsillectomy  GLP-1: yes AP/AC: no  Tobacco: no  PMHx: HTN, IDA, HLD, Menorrhagia, T2DM, Migraine  RADIOGRAPHIC EVALUATION AND INDEPENDENT REVIEW OF OTHER RECORDS:: [name] (08/12/2023) Referral notes reviewed and uploaded or available in chart in media tab: noted epistaxis for 2 months, 2-3 times per week; can take up to 2 hours to stop(?); Dx: Epistaxis; Rx: ref to ENT, Flonase Labs reviewed and uploaded or available in chart in media tab (06/16/2023): CMP and CBC -- BUN/Cr 12/0.45; LFT wnl; WBC 3.9, Hgb 10.7; Plt 294  Past Medical History:  Diagnosis Date   Anemia    Asthma     no issues as adult   Gestational diabetes    Headache(784.0)    Hepatitis B    Hypercholesteremia    Hypertension    during pregnancy   Migraine    Osteoarthritis    Tonsillitis, chronic    Past Surgical History:  Procedure Laterality Date   NO PAST SURGERIES     TONSILLECTOMY N/A 06/30/2017   Procedure: TONSILLECTOMY;  Surgeon: Ethyl Lonni BRAVO, MD;  Location: [facility];  Service: ENT;  Laterality: N/A;   TONSILLECTOMY     Family History  Problem Relation Age of Onset   Diabetes Father    Hypertension Father    Stroke Father    Asthma Brother    Asthma Son    Hearing loss Maternal Aunt    Alcohol abuse Neg Hx    Arthritis Neg Hx    Birth defects Neg Hx    Cancer Neg Hx    COPD Neg Hx    Depression Neg Hx    Drug abuse Neg Hx    Early death Neg Hx    Heart disease Neg Hx    Hyperlipidemia Neg Hx    Kidney disease Neg Hx    Learning disabilities Neg Hx    Mental illness Neg Hx    Mental retardation Neg Hx    Miscarriages / Stillbirths Neg Hx    Vision loss Neg Hx    Social History   Tobacco Use   Smoking status: Never   Smokeless tobacco: Never  Substance Use Topics   Alcohol  use: No   No Known Allergies Current Outpatient Medications  Medication Sig Dispense Refill   amLODipine (NORVASC) 2.5 MG tablet Take 2.5 mg by mouth daily.     ferrous sulfate  325 (65 FE) MG EC tablet Take 325 mg by mouth daily with breakfast.     fluticasone (FLONASE) 50 MCG/ACT nasal spray 1 spray(s) in each nostril once a day     losartan (COZAAR) 25 MG tablet Take 25 mg by mouth daily.     Menthol , Topical Analgesic, 5 % GEL Use topically as directed for pain for 3 to 7 days as needed. Do not apply to open wounds, infections or exfoliative dermatitis.     mupirocin ointment (BACTROBAN) 2 % Apply 1 Application topically 2 (two) times daily for 7 days. Apply to both nostrils 22 g 0   norethindrone (MICRONOR) 0.35 MG tablet Take 1 tablet by mouth daily.      OZEMPIC, 2 MG/DOSE, 8 MG/3ML SOPN Inject 2 mg into the skin once a week.     No current facility-administered medications for this visit.   BP 114/76 (BP Location: Left Arm, Patient Position: Sitting, Cuff Size: Large)   Pulse 90   Ht 5' 5 (1.651 m)   Wt 194 lb (88 kg)   SpO2 96%   BMI 32.28 kg/m   PHYSICAL EXAM:  BP 114/76 (BP Location: Left Arm, Patient Position: Sitting, Cuff Size: Large)   Pulse 90   Ht 5' 5 (1.651 m)   Wt 194 lb (88 kg)   SpO2 96%   BMI 32.28 kg/m    Salient findings:  CN II-XII intact  Bilateral EAC clear and TM intact with well pneumatized middle ear spaces Nose: Anterior rhinoscopy reveals no prominent septal vessels, bilateral ITH.  Nasal endoscopy was indicated to better evaluate the nose and paranasal sinuses, given the patient's history and exam findings, and is detailed below. No lesions of oral cavity/oropharynx No obviously palpable neck masses/lymphadenopathy/thyromegaly No respiratory distress or stridor   PROCEDURE:  Prior to initiating any procedures, risks/benefits/alternatives were explained to the patient and verbal consent obtained. PROCEDURE: Bilateral Rigid Nasal Endoscopy with endoscopic control of right sided epistaxis (CPT [phone]) Pre-procedure diagnosis: Right Epistaxis Post-procedure diagnosis: same Indication: See pre-procedure diagnosis and physical exam above Complications: None apparent EBL: minimal mL Anesthesia: Lidocaine  4% and topical decongestant was topically sprayed in each nasal cavity  Description of Procedure:  Patient was identified as correct patient. Verbal consent obtained. Afrin/lidocaine  mix was sprayed into the nose in both nasal cavities. Subsequently, a headlight and speculum were used and no prominent vessels were noted over septum or other obvious lesions. Therefore, nasal endoscopy was necessary to evaluate and control the epistaxis.  A rigid 30 degree endoscope was utilized to evaluate the sinonasal  cavities, mucosa, sinus ostia and turbinates and septum bilaterally. On the left, there were no prominent septal vessels. On the contralateral side, there were not prominent septal vessels but there was a prominent vessel just posterior to the head of right inferior turbinate which was likely source of bleeding. No other masses or lesions noted over b/l MM or SE recess. We discussed options including humidification, v/s cautery and R/B/A and patient/caregiver opted for cauterization. Given lack of complete visualization of the posterior portion of the vessel extent, decision was performed to perform cauterization after consent. Using the endoscope, the area of suspected bleeding over right inferior turbinate was visualized and then spot cauterized using silver nitrate cautery.  No bleeding was seen. Mupirocin  ointment was applied to both nares. Patient tolerated the procedure well.   ASSESSMENT:  53 y.o. with:  1. Epistaxis    Noted right epistaxis, likely source Right IT. We discussed options and given vaseline ineffective, patient opted for cautery which was done  PLAN: We've discussed issues and options today.  We reviewed the nasal endoscopy images together.  The risks, benefits and alternatives were discussed and questions answered.  She has elected to proceed with:  1) Mupirocin ointment BID x7d, then vaseline BID 2) Keep epistaxis diary; if persists after 6 weeks, she will call for re-evaluation  See below regarding exact medications prescribed this encounter including dosages and route: Meds ordered this encounter  Medications   mupirocin ointment (BACTROBAN) 2 %    Sig: Apply 1 Application topically 2 (two) times daily for 7 days. Apply to both nostrils    Dispense:  22 g    Refill:  0     Thank you for allowing me the opportunity to care for your patient. Please do not hesitate to contact me should you have any other questions.  Sincerely, Eldora Blanch, MD Otolaryngologist  (ENT), [facility] Phone: [phone] Fax: [phone]  MDM:  Level 4: [phone] Complexity/Problems addressed: mod - chronic problems, worsening/exacerbation Data complexity: mod - independent review of note, labs - Morbidity: mod  - Drug prescribed or managed: y  11/12/2023, 12:11 PM

## 2023-11-17 ENCOUNTER — Other Ambulatory Visit: Payer: Self-pay | Admitting: Hematology and Oncology

## 2023-11-17 DIAGNOSIS — D500 Iron deficiency anemia secondary to blood loss (chronic): Secondary | ICD-10-CM

## 2023-11-18 ENCOUNTER — Inpatient Hospital Stay: Attending: Hematology and Oncology

## 2023-11-18 DIAGNOSIS — D509 Iron deficiency anemia, unspecified: Secondary | ICD-10-CM | POA: Insufficient documentation

## 2023-11-18 DIAGNOSIS — D500 Iron deficiency anemia secondary to blood loss (chronic): Secondary | ICD-10-CM

## 2023-11-18 DIAGNOSIS — N920 Excessive and frequent menstruation with regular cycle: Secondary | ICD-10-CM | POA: Insufficient documentation

## 2023-11-18 LAB — CBC WITH DIFFERENTIAL (CANCER CENTER ONLY)
Abs Immature Granulocytes: 0.01 K/uL (ref 0.00–0.07)
Basophils Absolute: 0.1 K/uL (ref 0.0–0.1)
Basophils Relative: 2 %
Eosinophils Absolute: 0.1 K/uL (ref 0.0–0.5)
Eosinophils Relative: 2 %
HCT: 35.7 % — ABNORMAL LOW (ref 36.0–46.0)
Hemoglobin: 11.8 g/dL — ABNORMAL LOW (ref 12.0–15.0)
Immature Granulocytes: 0 %
Lymphocytes Relative: 29 %
Lymphs Abs: 1.2 K/uL (ref 0.7–4.0)
MCH: 29.7 pg (ref 26.0–34.0)
MCHC: 33.1 g/dL (ref 30.0–36.0)
MCV: 89.9 fL (ref 80.0–100.0)
Monocytes Absolute: 0.3 K/uL (ref 0.1–1.0)
Monocytes Relative: 6 %
Neutro Abs: 2.5 K/uL (ref 1.7–7.7)
Neutrophils Relative %: 61 %
Platelet Count: 301 K/uL (ref 150–400)
RBC: 3.97 MIL/uL (ref 3.87–5.11)
RDW: 12.2 % (ref 11.5–15.5)
WBC Count: 4.1 K/uL (ref 4.0–10.5)
nRBC: 0 % (ref 0.0–0.2)

## 2023-11-18 LAB — CMP (CANCER CENTER ONLY)
ALT: 18 U/L (ref 0–44)
AST: 15 U/L (ref 15–41)
Albumin: 4 g/dL (ref 3.5–5.0)
Alkaline Phosphatase: 95 U/L (ref 38–126)
Anion gap: 5 (ref 5–15)
BUN: 13 mg/dL (ref 6–20)
CO2: 28 mmol/L (ref 22–32)
Calcium: 9.4 mg/dL (ref 8.9–10.3)
Chloride: 107 mmol/L (ref 98–111)
Creatinine: 0.58 mg/dL (ref 0.44–1.00)
GFR, Estimated: >60 mL/min (ref >60)
Glucose, Bld: 120 mg/dL — ABNORMAL HIGH (ref 70–99)
Potassium: 3.7 mmol/L (ref 3.5–5.1)
Sodium: 140 mmol/L (ref 135–145)
Total Bilirubin: 0.5 mg/dL (ref 0.0–1.2)
Total Protein: 7.7 g/dL (ref 6.5–8.1)

## 2023-11-18 LAB — IRON AND IRON BINDING CAPACITY (CC-WL,HP ONLY)
Iron: 93 ug/dL (ref 28–170)
Saturation Ratios: 25 % (ref 10.4–31.8)
TIBC: 378 ug/dL (ref 250–450)
UIBC: 285 ug/dL (ref 148–442)

## 2023-11-18 LAB — FERRITIN: Ferritin: 139 ng/mL (ref 11–307)

## 2023-11-18 LAB — RETIC PANEL
Immature Retic Fract: 5.3 % (ref 2.3–15.9)
RBC.: 3.96 MIL/uL (ref 3.87–5.11)
Retic Count, Absolute: 49.1 K/uL (ref 19.0–186.0)
Retic Ct Pct: 1.2 % (ref 0.4–3.1)
Reticulocyte Hemoglobin: 33.7 pg (ref >27.9)

## 2023-11-25 ENCOUNTER — Inpatient Hospital Stay: Admitting: Hematology and Oncology

## 2023-11-25 VITALS — BP 129/84 | HR 89 | Temp 98.2°F | Resp 15 | Wt 204.4 lb

## 2023-11-25 DIAGNOSIS — D500 Iron deficiency anemia secondary to blood loss (chronic): Secondary | ICD-10-CM

## 2023-11-25 NOTE — Progress Notes (Signed)
 [facility] Telephone:([phone]) [phone]   Fax:([phone]) [phone]  PROGRESS NOTE  Patient Care Team: Catalina Bare, MD as PCP - General (Internal Medicine)  Hematological/Oncological History # Iron  Deficiency Anemia 2/2 to GYN Bleeding 05/08/2020: WBC 2.8, Hgb 11.3, MCV 84.7, Plt 212. Ferritin 223.  06/16/2023: WBC 3.7, Hgb 10.7, MCV 89.9, Plt 294.  07/27/2023: establish care with Dr. Federico  08/17/2023: Patient received 1 dose of Monoferric  1000 mg IV  Interval History:  Mary Richardson 53 y.o. female with medical history significant for IDA 2/2 GYN bleeding who presents for a follow up visit. The patient's last visit was on 07/27/2023. In the interim since the last visit she received 1 dose of Monoferric  on 08/17/2023.  On exam today Mary Richardson reports that she has been well overall in the room since her last visit.  She reports her iron  infusion went well with no major side effects.  She reports it helped a little.  She notes that she has been getting nosebleeds since that time and eventually was evaluated by ENT who found a leaking blood vessel.  She underwent cauterization and has not had any subsequent issues.  She reports is not having any bleeding elsewhere such as blood in the urine or stool.  She reports her menstrual cycles continue to be heavy for the first 3 days but are still manageable.  She stopped taking her iron  pills due to constipation.  She thinks her Ozempic may have been contributing to this as well.  She has been having no difficulty with fevers, chills, sweats, nausea, vomiting or diarrhea.  She denies any runny nose, sore throat, cough.  She reports her energy levels are good at about a 5 out of 10.  She notes that she has a good appetite and does her best to try to eat green leafy vegetables as well as red meat.  A full 10 point ROS is otherwise negative.  MEDICAL HISTORY:  Past Medical History:  Diagnosis Date   Anemia    Asthma    no issues as adult    Gestational diabetes    Headache(784.0)    Hepatitis B    Hypercholesteremia    Hypertension    during pregnancy   Migraine    Osteoarthritis    Tonsillitis, chronic     SURGICAL HISTORY: Past Surgical History:  Procedure Laterality Date   NO PAST SURGERIES     TONSILLECTOMY N/A 06/30/2017   Procedure: TONSILLECTOMY;  Surgeon: Ethyl Lonni BRAVO, MD;  Location: [facility];  Service: ENT;  Laterality: N/A;   TONSILLECTOMY      SOCIAL HISTORY: Social History   Socioeconomic History   Marital status: Single    Spouse name: Not on file   Number of children: Not on file   Years of education: Not on file   Highest education level: Not on file  Occupational History   Not on file  Tobacco Use   Smoking status: Never   Smokeless tobacco: Never  Vaping Use   Vaping status: Never Used  Substance and Sexual Activity   Alcohol use: No   Drug use: No   Sexual activity: Not Currently    Birth control/protection: Pill  Other Topics Concern   Not on file  Social History Narrative   Not on file   Social Drivers of Health   Financial Resource Strain: Not on file  Food Insecurity: No Food Insecurity (07/27/2023)   Hunger Vital Sign    Worried About Running  Out of Food in the Last Year: Never true    Ran Out of Food in the Last Year: Never true  Transportation Needs: No Transportation Needs (07/27/2023)   PRAPARE - Administrator, Civil Service (Medical): No    Lack of Transportation (Non-Medical): No  Physical Activity: Not on file  Stress: Not on file  Social Connections: Not on file  Intimate Partner Violence: Not At Risk (07/27/2023)   Humiliation, Afraid, Rape, and Kick questionnaire    Fear of Current or Ex-Partner: No    Emotionally Abused: No    Physically Abused: No    Sexually Abused: No    FAMILY HISTORY: Family History  Problem Relation Age of Onset   Diabetes Father    Hypertension Father    Stroke Father    Asthma Brother     Asthma Son    Hearing loss Maternal Aunt    Alcohol abuse Neg Hx    Arthritis Neg Hx    Birth defects Neg Hx    Cancer Neg Hx    COPD Neg Hx    Depression Neg Hx    Drug abuse Neg Hx    Early death Neg Hx    Heart disease Neg Hx    Hyperlipidemia Neg Hx    Kidney disease Neg Hx    Learning disabilities Neg Hx    Mental illness Neg Hx    Mental retardation Neg Hx    Miscarriages / Stillbirths Neg Hx    Vision loss Neg Hx     ALLERGIES:  has no known allergies.  MEDICATIONS:  Current Outpatient Medications  Medication Sig Dispense Refill   amLODipine (NORVASC) 2.5 MG tablet Take 2.5 mg by mouth daily.     ferrous sulfate  325 (65 FE) MG EC tablet Take 325 mg by mouth daily with breakfast.     fluticasone (FLONASE) 50 MCG/ACT nasal spray 1 spray(s) in each nostril once a day     losartan (COZAAR) 25 MG tablet Take 25 mg by mouth daily.     Menthol , Topical Analgesic, 5 % GEL Use topically as directed for pain for 3 to 7 days as needed. Do not apply to open wounds, infections or exfoliative dermatitis.     norethindrone (MICRONOR) 0.35 MG tablet Take 1 tablet by mouth daily.     OZEMPIC, 2 MG/DOSE, 8 MG/3ML SOPN Inject 2 mg into the skin once a week.     No current facility-administered medications for this visit.    REVIEW OF SYSTEMS:   Constitutional: ( - ) fevers, ( - )  chills , ( - ) night sweats Eyes: ( - ) blurriness of vision, ( - ) double vision, ( - ) watery eyes Ears, nose, mouth, throat, and face: ( - ) mucositis, ( - ) sore throat Respiratory: ( - ) cough, ( - ) dyspnea, ( - ) wheezes Cardiovascular: ( - ) palpitation, ( - ) chest discomfort, ( - ) lower extremity swelling Gastrointestinal:  ( - ) nausea, ( - ) heartburn, ( - ) change in bowel habits Skin: ( - ) abnormal skin rashes Lymphatics: ( - ) new lymphadenopathy, ( - ) easy bruising Neurological: ( - ) numbness, ( - ) tingling, ( - ) new weaknesses Behavioral/Psych: ( - ) mood change, ( - ) new changes   All other systems were reviewed with the patient and are negative.  PHYSICAL EXAMINATION: Vitals:   11/25/23 1059  BP: 129/84  Pulse: 89  Resp: 15  Temp: 98.2 F (36.8 C)  SpO2: 98%   Filed Weights   11/25/23 1059  Weight: 204 lb 6.4 oz (92.7 kg)    GENERAL: Well-appearing middle-age African-American female, alert, no distress and comfortable SKIN: skin color, texture, turgor are normal, no rashes or significant lesions EYES: conjunctiva are pink and non-injected, sclera clear LUNGS: clear to auscultation and percussion with normal breathing effort HEART: regular rate & rhythm and no murmurs and no lower extremity edema Musculoskeletal: no cyanosis of digits and no clubbing  PSYCH: alert & oriented x 3, fluent speech NEURO: no focal motor/sensory deficits  LABORATORY DATA:  I have reviewed the data as listed    Latest Ref Rng & Units 11/18/2023   10:30 AM 07/27/2023    9:40 AM 05/08/2020    9:30 AM  CBC  WBC 4.0 - 10.5 K/uL 4.1  3.9  2.8   Hemoglobin 12.0 - 15.0 g/dL 88.1  89.5  88.6   Hematocrit 36.0 - 46.0 % 35.7  31.2  34.8   Platelets 150 - 400 K/uL 301  296  212        Latest Ref Rng & Units 11/18/2023   10:30 AM 07/27/2023    9:40 AM 03/20/2020   12:23 PM  CMP  Glucose 70 - 99 mg/dL 879  92  99   BUN 6 - 20 mg/dL 13  12  12    Creatinine 0.44 - 1.00 mg/dL 9.41  9.47  9.29   Sodium 135 - 145 mmol/L 140  138  136   Potassium 3.5 - 5.1 mmol/L 3.7  3.6  3.8   Chloride 98 - 111 mmol/L 107  107  105   CO2 22 - 32 mmol/L 28  27  25    Calcium 8.9 - 10.3 mg/dL 9.4  9.0  9.1   Total Protein 6.5 - 8.1 g/dL 7.7  7.1  7.5   Total Bilirubin 0.0 - 1.2 mg/dL 0.5  0.6  0.4   Alkaline Phos 38 - 126 U/L 95  85  88   AST 15 - 41 U/L 15  13  14    ALT 0 - 44 U/L 18  13  16      RADIOGRAPHIC STUDIES: No results found.  ASSESSMENT & PLAN Mary Richardson 53 y.o. female with medical history significant for IDA 2/2 GYN bleeding who presents for a follow up visit.  After review of  the labs, review of the records, and discussion with the patient the patients findings are most consistent with IDA in setting of GYN bleeding.    # Iron  Deficiency Anemia 2/2 to GYN Bleeding -- Findings are consistent with iron  deficiency anemia secondary to patient's menorrhagia --Encouraged her to follow-up with OB/GYN for better control of her menstrual cycles --Labs today show white blood cell 4.1, hemoglobin 9.8, MCV 89.9, platelets 301.  Ferritin 139 with iron  sat 25%  --Continue ferrous sulfate  325 mg daily with a source of vitamin C --We will plan to proceed with IV iron  therapy in order to help bolster the patient's blood counts if her iron  levels are persistently low.  Patient last received Monoferric  in August 2025. --RTC in 3 months for labs and 6 months for clinic visit   No orders of the defined types were placed in this encounter.   All questions were answered. The patient knows to call the clinic with any problems, questions or concerns.  A total of more than 30 minutes were spent on this encounter  with face-to-face time and non-face-to-face time, including preparing to see the patient, ordering tests and/or medications, counseling the patient and coordination of care as outlined above.   Norleen IVAR Kidney, MD Department of Hematology/Oncology [facility] Phone: [phone] Pager: [phone] Email: [email or web address]  11/25/2023 4:11 PM

## 2023-12-24 ENCOUNTER — Ambulatory Visit (INDEPENDENT_AMBULATORY_CARE_PROVIDER_SITE_OTHER): Admitting: Otolaryngology

## 2023-12-24 ENCOUNTER — Encounter (INDEPENDENT_AMBULATORY_CARE_PROVIDER_SITE_OTHER): Payer: Self-pay | Admitting: Otolaryngology

## 2023-12-24 VITALS — BP 119/79 | HR 84 | Ht 65.0 in | Wt 194.0 lb

## 2023-12-24 DIAGNOSIS — R040 Epistaxis: Secondary | ICD-10-CM

## 2023-12-24 NOTE — Progress Notes (Signed)
 Dear Dr. Catalina, Here is my assessment for our mutual patient, Mary Richardson. Thank you for allowing me the opportunity to care for your patient. Please do not hesitate to contact me should you have any other questions. Sincerely, Dr. Eldora Blanch  Otolaryngology Clinic Note  HISTORY:  Discussed the use of AI scribe software for clinical note transcription with the patient, who gave verbal consent to proceed.  History of Present Illness Mary Richardson is a 53 year old female who presents for Epistaxis  She experiences epistaxis primarily from the right nostril, with episodes increasing in frequency over the past year, now occurring two to three times in the past week. Each episode lasts 30 seconds to a minute and resolves with pressure. Nasal dryness is present, and she uses menthol  and Vaseline for relief. Scratching or itching inside her nose can trigger bleeding, though she tries to not manipulate the nose. A steroid nasal spray was ineffective.  She has a history of anemia and has not experienced bleeding issues following surgery. There is no history of trauma to the nose, liver or kidney problems, and she does not smoke. There is no family history of bleeding disorders. Her hypertension is managed with medication, and her type 2 diabetes is managed with Ozempic.  No h/o sinus infections  --------------------------------------------------------- 12/24/2023 Returns for f/u. Doing very well, she is happy that she is not bleeding. Using nasal sprays and humidification measures.   ENT Surgery: Tonsillectomy  GLP-1: yes AP/AC: no  Tobacco: no  PMHx: HTN, IDA, HLD, Menorrhagia, T2DM, Migraine  RADIOGRAPHIC EVALUATION AND INDEPENDENT REVIEW OF OTHER RECORDS:: Rosina Amy (08/12/2023) Referral notes reviewed and uploaded or available in chart in media tab: noted epistaxis for 2 months, 2-3 times per week; can take up to 2 hours to stop(?); Dx: Epistaxis; Rx: ref to ENT, Flonase Labs  reviewed and uploaded or available in chart in media tab (06/16/2023): CMP and CBC -- BUN/Cr 12/0.45; LFT wnl; WBC 3.9, Hgb 10.7; Plt 294  Past Medical History:  Diagnosis Date   Anemia    Asthma    no issues as adult   Gestational diabetes    Headache(784.0)    Hepatitis B    Hypercholesteremia    Hypertension    during pregnancy   Migraine    Osteoarthritis    Tonsillitis, chronic    Past Surgical History:  Procedure Laterality Date   NO PAST SURGERIES     TONSILLECTOMY N/A 06/30/2017   Procedure: TONSILLECTOMY;  Surgeon: Ethyl Lonni BRAVO, MD;  Location: [facility];  Service: ENT;  Laterality: N/A;   TONSILLECTOMY     Family History  Problem Relation Age of Onset   Diabetes Father    Hypertension Father    Stroke Father    Asthma Brother    Asthma Son    Hearing loss Maternal Aunt    Alcohol abuse Neg Hx    Arthritis Neg Hx    Birth defects Neg Hx    Cancer Neg Hx    COPD Neg Hx    Depression Neg Hx    Drug abuse Neg Hx    Early death Neg Hx    Heart disease Neg Hx    Hyperlipidemia Neg Hx    Kidney disease Neg Hx    Learning disabilities Neg Hx    Mental illness Neg Hx    Mental retardation Neg Hx    Miscarriages / Stillbirths Neg Hx    Vision loss Neg Hx  Social History   Tobacco Use   Smoking status: Never   Smokeless tobacco: Never  Substance Use Topics   Alcohol use: No   No Known Allergies Current Outpatient Medications  Medication Sig Dispense Refill   amLODipine (NORVASC) 2.5 MG tablet Take 2.5 mg by mouth daily.     ferrous sulfate  325 (65 FE) MG EC tablet Take 325 mg by mouth daily with breakfast.     fluticasone (FLONASE) 50 MCG/ACT nasal spray 1 spray(s) in each nostril once a day     losartan (COZAAR) 25 MG tablet Take 25 mg by mouth daily.     Menthol , Topical Analgesic, 5 % GEL Use topically as directed for pain for 3 to 7 days as needed. Do not apply to open wounds, infections or exfoliative dermatitis.      OZEMPIC, 2 MG/DOSE, 8 MG/3ML SOPN Inject 2 mg into the skin once a week.     norethindrone (MICRONOR) 0.35 MG tablet Take 1 tablet by mouth daily. (Patient not taking: Reported on 12/24/2023)     No current facility-administered medications for this visit.   BP 119/79 (BP Location: Right Arm, Patient Position: Sitting, Cuff Size: Large)   Pulse 84   Ht 5' 5 (1.651 m)   Wt 194 lb (88 kg)   SpO2 96%   BMI 32.28 kg/m   PHYSICAL EXAM:  BP 119/79 (BP Location: Right Arm, Patient Position: Sitting, Cuff Size: Large)   Pulse 84   Ht 5' 5 (1.651 m)   Wt 194 lb (88 kg)   SpO2 96%   BMI 32.28 kg/m    Salient findings:  CN II-XII intact  Bilateral EAC clear and TM intact with well pneumatized middle ear spaces Nose: Anterior rhinoscopy reveals no prominent septal vessels, bilateral ITH.  Nasal endoscopy was indicated to better evaluate the nose and paranasal sinuses, given the patient's history and exam findings, and is detailed below. No lesions of oral cavity/oropharynx No obviously palpable neck masses/lymphadenopathy/thyromegaly No respiratory distress or stridor   PROCEDURE:  Prior to initiating any procedures, risks/benefits/alternatives were explained to the patient and verbal consent obtained. PROCEDURE: Bilateral Diagnostic Rigid Nasal Endoscopy Pre-procedure diagnosis: Epistaxis s/p cautery, assess healing Post-procedure diagnosis: same Indication: See pre-procedure diagnosis and physical exam above Complications: None apparent EBL: 0 mL Anesthesia: Lidocaine  4% and topical decongestant was topically sprayed in each nasal cavity  Description of Procedure:  Patient was identified. A rigid 30 degree endoscope was utilized to evaluate the sinonasal cavities, mucosa, sinus ostia and turbinates and septum.  Overall, signs of mucosal inflammation are not noted.  Also noted are no prominent septal vessels; prior IT prominent vessel resolved.  No mucopurulence, polyps, or masses  noted.   Right Middle meatus: clear Right SE Recess: clear Left MM: clear Left SE Recess: clear   CPT CODE -- 31231 - Mod 25   ASSESSMENT:  53 y.o. with:  1. Epistaxis    Noted right epistaxis, likely source Right IT. We discussed options and given vaseline ineffective, patient opted for cautery which was done  Doing well --- no bleeding; endo reassuring and well healed without synechiae Continue humidification measures - f/u PRN  See below regarding exact medications prescribed this encounter including dosages and route: No orders of the defined types were placed in this encounter.    Thank you for allowing me the opportunity to care for your patient. Please do not hesitate to contact me should you have any other questions.  Sincerely, Eldora Blanch, MD  Otolaryngologist (ENT), [facility] Phone: [phone] Fax: [phone]  MDM:  Level 3 - 99213 Complexity/Problems addressed: low Data complexity: low - Morbidity: low - Drug prescribed or managed: n  12/24/2023, 9:35 AM

## 2024-02-25 ENCOUNTER — Inpatient Hospital Stay: Attending: Hematology and Oncology

## 2024-05-24 ENCOUNTER — Inpatient Hospital Stay

## 2024-05-24 ENCOUNTER — Inpatient Hospital Stay: Admitting: Physician Assistant
